# Patient Record
Sex: Female | Born: 1975
Health system: Southern US, Community
[De-identification: ages and names within clinical notes are randomized; demographics above are authoritative.]

## PROBLEM LIST (undated history)

## (undated) DIAGNOSIS — F419 Anxiety disorder, unspecified: Secondary | ICD-10-CM

## (undated) DIAGNOSIS — Z789 Other specified health status: Secondary | ICD-10-CM

## (undated) DIAGNOSIS — G43909 Migraine, unspecified, not intractable, without status migrainosus: Secondary | ICD-10-CM

## (undated) DIAGNOSIS — E039 Hypothyroidism, unspecified: Secondary | ICD-10-CM

## (undated) DIAGNOSIS — F1910 Other psychoactive substance abuse, uncomplicated: Secondary | ICD-10-CM

## (undated) HISTORY — PX: WISDOM TOOTH EXTRACTION: SHX21

## (undated) HISTORY — DX: Migraine, unspecified, not intractable, without status migrainosus: G43.909

## (undated) HISTORY — DX: Hypothyroidism, unspecified: E03.9

## (undated) HISTORY — DX: Anxiety disorder, unspecified: F41.9

## (undated) HISTORY — DX: Other psychoactive substance abuse, uncomplicated: F19.10

---

## 2012-05-22 ENCOUNTER — Other Ambulatory Visit: Payer: Self-pay | Admitting: Obstetrics & Gynecology

## 2012-05-22 ENCOUNTER — Other Ambulatory Visit: Payer: Self-pay | Admitting: Obstetrics and Gynecology

## 2012-05-22 DIAGNOSIS — N6320 Unspecified lump in the left breast, unspecified quadrant: Secondary | ICD-10-CM

## 2012-05-22 DIAGNOSIS — N6310 Unspecified lump in the right breast, unspecified quadrant: Secondary | ICD-10-CM

## 2012-05-24 ENCOUNTER — Ambulatory Visit
Admission: RE | Admit: 2012-05-24 | Discharge: 2012-05-24 | Disposition: A | Discharge status: Home / Self Care | Payer: Federal, State, Local not specified - PPO | Source: Ambulatory Visit | Attending: Obstetrics and Gynecology | Admitting: Obstetrics and Gynecology

## 2012-05-24 DIAGNOSIS — N6320 Unspecified lump in the left breast, unspecified quadrant: Secondary | ICD-10-CM

## 2012-05-24 DIAGNOSIS — N6310 Unspecified lump in the right breast, unspecified quadrant: Secondary | ICD-10-CM

## 2012-10-05 ENCOUNTER — Encounter (HOSPITAL_COMMUNITY): Payer: Self-pay | Admitting: *Deleted

## 2012-10-05 ENCOUNTER — Encounter (HOSPITAL_COMMUNITY): Payer: Self-pay

## 2012-10-09 NOTE — H&P (Signed)
Jennifer Scott is an 37 y.o. female with postcoital spotting.  She has a cervical polyp which has been removed but has reformed.  Additionally, the endometrial lining is thickened on ultrasound.  Pt wishes to maintain childbearing potential.    Pertinent Gynecological History: Menses: flow is moderate Bleeding: intermenstrual bleeding Contraception: none DES exposure: unknown Blood transfusions: none Sexually transmitted diseases: no past history Previous GYN Procedures: none  Last mammogram: normal Date: 2014 Last pap: normal Date: 2014 OB History: G0   Menstrual History: Menarche age: n/a No LMP recorded.    Past Medical History  Diagnosis Date  . Medical history non-contributory     History reviewed. No pertinent past surgical history.  History reviewed. No pertinent family history.  Social History:  reports that she has never smoked. She does not have any smokeless tobacco history on file. Her alcohol and drug histories are not on file.  Allergies: No Known Allergies  No prescriptions prior to admission    ROS  Height 5\' 9"  (1.753 m), weight 62.596 kg (138 lb). Physical Exam  Constitutional: She is oriented to person, place, and time. She appears well-developed and well-nourished.  Cardiovascular: Normal rate and regular rhythm.   Respiratory: Effort normal and breath sounds normal.  GI: Soft. There is no rebound and no guarding.  Neurological: She is alert and oriented to person, place, and time.  Skin: Skin is warm and dry.  Psychiatric: She has a normal mood and affect. Her behavior is normal.    No results found for this or any previous visit (from the past 24 hour(s)).  No results found.  Assessment/Plan: 37 yo G0 with postcoital bleeding, cervical polyp H/S, D&C, cervical polypectomy  Jennifer Scott 10/09/2012, 8:55 PM

## 2012-10-10 ENCOUNTER — Encounter (HOSPITAL_COMMUNITY): Payer: Self-pay | Admitting: *Deleted

## 2012-10-10 ENCOUNTER — Ambulatory Visit (HOSPITAL_COMMUNITY): Payer: Federal, State, Local not specified - PPO | Admitting: Anesthesiology

## 2012-10-10 ENCOUNTER — Encounter (HOSPITAL_COMMUNITY)
Admission: RE | Disposition: A | Discharge status: Home / Self Care | Payer: Self-pay | Source: Ambulatory Visit | Attending: Obstetrics & Gynecology

## 2012-10-10 ENCOUNTER — Encounter (HOSPITAL_COMMUNITY): Payer: Self-pay | Admitting: Anesthesiology

## 2012-10-10 ENCOUNTER — Ambulatory Visit (HOSPITAL_COMMUNITY)
Admission: RE | Admit: 2012-10-10 | Discharge: 2012-10-10 | Disposition: A | Discharge status: Home / Self Care | Payer: Federal, State, Local not specified - PPO | Source: Ambulatory Visit | Attending: Obstetrics & Gynecology | Admitting: Obstetrics & Gynecology

## 2012-10-10 DIAGNOSIS — N840 Polyp of corpus uteri: Secondary | ICD-10-CM | POA: Insufficient documentation

## 2012-10-10 DIAGNOSIS — N926 Irregular menstruation, unspecified: Secondary | ICD-10-CM | POA: Insufficient documentation

## 2012-10-10 DIAGNOSIS — N841 Polyp of cervix uteri: Secondary | ICD-10-CM | POA: Insufficient documentation

## 2012-10-10 DIAGNOSIS — N930 Postcoital and contact bleeding: Secondary | ICD-10-CM | POA: Insufficient documentation

## 2012-10-10 HISTORY — PX: HYSTEROSCOPY WITH D & C: SHX1775

## 2012-10-10 HISTORY — PX: CERVICAL POLYPECTOMY: SHX88

## 2012-10-10 HISTORY — DX: Other specified health status: Z78.9

## 2012-10-10 SURGERY — DILATATION AND CURETTAGE /HYSTEROSCOPY
Anesthesia: General | Site: Vagina | Wound class: Clean Contaminated

## 2012-10-10 MED ORDER — IBUPROFEN 800 MG PO TABS: 800.0000 mg | ORAL_TABLET | Freq: Four times a day (QID) | ORAL | Status: DC | PRN

## 2012-10-10 MED ORDER — DEXTROSE IN LACTATED RINGERS 5 % IV SOLN: INTRAVENOUS | Status: DC

## 2012-10-10 MED ORDER — GLYCINE 1.5 % IR SOLN
Status: DC | PRN
  Administered 2012-10-10: 3000 mL

## 2012-10-10 MED ORDER — FENTANYL CITRATE 0.05 MG/ML IJ SOLN
INTRAMUSCULAR | Status: DC | PRN
  Administered 2012-10-10 (×2): 50 ug via INTRAVENOUS

## 2012-10-10 MED ORDER — MIDAZOLAM HCL 5 MG/5ML IJ SOLN
INTRAMUSCULAR | Status: DC | PRN
  Administered 2012-10-10: 1 mg via INTRAVENOUS

## 2012-10-10 MED ORDER — FERRIC SUBSULFATE 259 MG/GM EX SOLN
CUTANEOUS | Status: DC | PRN
  Administered 2012-10-10: 1

## 2012-10-10 MED ORDER — KETOROLAC TROMETHAMINE 30 MG/ML IJ SOLN
INTRAMUSCULAR | Status: DC | PRN
  Administered 2012-10-10: 30 mg via INTRAVENOUS

## 2012-10-10 MED ORDER — KETOROLAC TROMETHAMINE 30 MG/ML IJ SOLN
INTRAMUSCULAR | Status: AC
  Filled 2012-10-10: qty 1

## 2012-10-10 MED ORDER — ONDANSETRON HCL 4 MG/2ML IJ SOLN
INTRAMUSCULAR | Status: DC | PRN
  Administered 2012-10-10: 4 mg via INTRAVENOUS

## 2012-10-10 MED ORDER — OXYCODONE-ACETAMINOPHEN 7.5-325 MG PO TABS: 1.0000 | ORAL_TABLET | ORAL | Status: DC | PRN

## 2012-10-10 MED ORDER — PROPOFOL 10 MG/ML IV BOLUS
INTRAVENOUS | Status: DC | PRN
  Administered 2012-10-10: 200 mg via INTRAVENOUS

## 2012-10-10 MED ORDER — ONDANSETRON HCL 4 MG/2ML IJ SOLN
INTRAMUSCULAR | Status: AC
  Filled 2012-10-10: qty 2

## 2012-10-10 MED ORDER — LACTATED RINGERS IV SOLN
INTRAVENOUS | Status: DC
  Administered 2012-10-10: 12:00:00 via INTRAVENOUS

## 2012-10-10 MED ORDER — LIDOCAINE HCL (CARDIAC) 20 MG/ML IV SOLN
INTRAVENOUS | Status: DC | PRN
  Administered 2012-10-10: 20 mg via INTRAVENOUS
  Administered 2012-10-10: 60 mg via INTRAVENOUS

## 2012-10-10 MED ORDER — FENTANYL CITRATE 0.05 MG/ML IJ SOLN: 25.0000 ug | INTRAMUSCULAR | Status: DC | PRN

## 2012-10-10 MED ORDER — LIDOCAINE HCL (CARDIAC) 20 MG/ML IV SOLN
INTRAVENOUS | Status: AC
  Filled 2012-10-10: qty 5

## 2012-10-10 MED ORDER — PROPOFOL 10 MG/ML IV EMUL
INTRAVENOUS | Status: AC
  Filled 2012-10-10: qty 20

## 2012-10-10 MED ORDER — MIDAZOLAM HCL 2 MG/2ML IJ SOLN
INTRAMUSCULAR | Status: AC
  Filled 2012-10-10: qty 2

## 2012-10-10 MED ORDER — FENTANYL CITRATE 0.05 MG/ML IJ SOLN
INTRAMUSCULAR | Status: AC
  Filled 2012-10-10: qty 2

## 2012-10-10 SURGICAL SUPPLY — 18 items
ABLATOR ENDOMETRIAL BIPOLAR (ABLATOR) IMPLANT
CANISTER SUCTION 2500CC (MISCELLANEOUS) ×2 IMPLANT
CATH ROBINSON RED A/P 16FR (CATHETERS) ×2 IMPLANT
CATH THERMACHOICE III (CATHETERS) IMPLANT
CLOTH BEACON ORANGE TIMEOUT ST (SAFETY) ×2 IMPLANT
CONTAINER PREFILL 10% NBF 60ML (FORM) ×4 IMPLANT
DRESSING TELFA 8X3 (GAUZE/BANDAGES/DRESSINGS) ×2 IMPLANT
ELECT REM PT RETURN 9FT ADLT (ELECTROSURGICAL) ×2
ELECTRODE REM PT RTRN 9FT ADLT (ELECTROSURGICAL) ×1 IMPLANT
GLOVE BIO SURGEON STRL SZ 6 (GLOVE) ×2 IMPLANT
GLOVE BIOGEL PI IND STRL 6 (GLOVE) ×2 IMPLANT
GLOVE BIOGEL PI INDICATOR 6 (GLOVE) ×2
GOWN STRL REIN XL XLG (GOWN DISPOSABLE) ×4 IMPLANT
LOOP ANGLED CUTTING 22FR (CUTTING LOOP) IMPLANT
PACK HYSTEROSCOPY LF (CUSTOM PROCEDURE TRAY) ×2 IMPLANT
PAD OB MATERNITY 4.3X12.25 (PERSONAL CARE ITEMS) ×2 IMPLANT
TOWEL OR 17X24 6PK STRL BLUE (TOWEL DISPOSABLE) ×4 IMPLANT
WATER STERILE IRR 1000ML POUR (IV SOLUTION) ×2 IMPLANT

## 2012-10-10 NOTE — Progress Notes (Signed)
No change to H&P. 

## 2012-10-10 NOTE — Anesthesia Preprocedure Evaluation (Signed)

## 2012-10-10 NOTE — Op Note (Signed)
PREOPERATIVE DIAGNOSIS:  Irregular uterine bleeding. POSTOPERATIVE DIAGNOSIS: The same PROCEDURE: Hysteroscopy, Dilation and Curettage, cervical polypectomy SURGEON:  Dr. Mitchel Honour   INDICATIONS: 37 y.o. G0 here for scheduled surgery for irregular uterine bleeding and cervical polyp.   Risks of surgery were discussed with the patient including but not limited to: bleeding which may require transfusion; infection which may require antibiotics; injury to uterus or surrounding organs; intrauterine scarring which may impair future fertility; need for additional procedures including laparotomy or laparoscopy; and other postoperative/anesthesia complications. Written informed consent was obtained.    FINDINGS:  A 6 week size uterus.  Diffuse proliferative endometrium.  Normal ostia bilaterally.  Cervical polyp  ANESTHESIA:   General ESTIMATED BLOOD LOSS:  Less than 20 ml SPECIMENS: Endometrial curettings and cervical biopsy sent to pathology COMPLICATIONS:  None immediate.  PROCEDURE DETAILS:  The patient was then taken to the operating room where general anesthesia was administered and was found to be adequate.  After an adequate timeout was performed, she was placed in the dorsal lithotomy position and examined; then prepped and draped in the sterile manner.   Her bladder was catheterized for an unmeasured amount of clear, yellow urine. A speculum was then placed in the patient's vagina and a single tooth tenaculum was applied to the anterior lip of the cervix.   The uteus was sounded to 7.5 cm and dilated manually with metal dilators to accommodate the 5.5 mm diagnostic hysteroscope.  Once the cervix was dilated, the hysteroscope was inserted under direct visualization using saline as a suspension medium.  The uterine cavity was carefully examined, both ostia were recognized.   After further careful visualization of the uterine cavity, the hysteroscope was removed under direct visualization.  A sharp  curettage was then performed to obtain a moderate amount of endometrial curettings.  The tenaculum was removed from the anterior lip of the cervix.  Attention was then turned to the cervical polyp which was removed using the Bovie knife.  The base was cauterized using the Bovie and Monsel's.  The vaginal speculum was removed after noting good hemostasis.  The patient tolerated the procedure well and was taken to the recovery area awake, extubated and in stable condition.

## 2012-10-10 NOTE — Transfer of Care (Signed)
Immediate Anesthesia Transfer of Care Note  Patient: Jennifer Scott  Procedure(s) Performed: Procedure(s): DILATATION AND CURETTAGE /HYSTEROSCOPY (N/A) CERVICAL POLYPECTOMY (N/A)  Patient Location: PACU  Anesthesia Type:General  Level of Consciousness: awake, oriented and patient cooperative  Airway & Oxygen Therapy: Patient Spontanous Breathing and Patient connected to nasal cannula oxygen  Post-op Assessment: Report given to PACU RN and Post -op Vital signs reviewed and stable  Post vital signs: Reviewed and stable  Complications: No apparent anesthesia complications

## 2012-10-11 ENCOUNTER — Encounter (HOSPITAL_COMMUNITY): Payer: Self-pay | Admitting: Obstetrics & Gynecology

## 2012-10-12 NOTE — Anesthesia Postprocedure Evaluation (Signed)
  Anesthesia Post-op Note  Patient: Jennifer Scott  Procedure(s) Performed: Procedure(s): DILATATION AND CURETTAGE /HYSTEROSCOPY (N/A) CERVICAL POLYPECTOMY (N/A) Patient is awake and responsive. Pain and nausea are reasonably well controlled. Vital signs are stable and clinically acceptable. Oxygen saturation is clinically acceptable. There are no apparent anesthetic complications at this time. Patient is ready for discharge.

## 2014-05-23 ENCOUNTER — Other Ambulatory Visit: Payer: Self-pay | Admitting: Obstetrics & Gynecology

## 2014-05-23 DIAGNOSIS — N6320 Unspecified lump in the left breast, unspecified quadrant: Secondary | ICD-10-CM

## 2014-05-30 ENCOUNTER — Other Ambulatory Visit: Payer: Self-pay | Admitting: Obstetrics & Gynecology

## 2014-05-30 ENCOUNTER — Ambulatory Visit
Admission: RE | Admit: 2014-05-30 | Discharge: 2014-05-30 | Disposition: A | Discharge status: Home / Self Care | Payer: Federal, State, Local not specified - PPO | Source: Ambulatory Visit | Attending: Obstetrics & Gynecology | Admitting: Obstetrics & Gynecology

## 2014-05-30 ENCOUNTER — Encounter (INDEPENDENT_AMBULATORY_CARE_PROVIDER_SITE_OTHER): Payer: Self-pay

## 2014-05-30 DIAGNOSIS — N6320 Unspecified lump in the left breast, unspecified quadrant: Secondary | ICD-10-CM

## 2014-06-25 ENCOUNTER — Other Ambulatory Visit: Payer: Self-pay | Admitting: Obstetrics & Gynecology

## 2014-09-24 ENCOUNTER — Other Ambulatory Visit: Payer: Self-pay | Admitting: Physician Assistant

## 2015-07-21 ENCOUNTER — Other Ambulatory Visit: Payer: Self-pay | Admitting: Obstetrics & Gynecology

## 2015-07-21 DIAGNOSIS — R922 Inconclusive mammogram: Secondary | ICD-10-CM

## 2015-07-21 DIAGNOSIS — Z1329 Encounter for screening for other suspected endocrine disorder: Secondary | ICD-10-CM | POA: Diagnosis not present

## 2015-07-21 DIAGNOSIS — Z1321 Encounter for screening for nutritional disorder: Secondary | ICD-10-CM | POA: Diagnosis not present

## 2015-07-21 DIAGNOSIS — Z13228 Encounter for screening for other metabolic disorders: Secondary | ICD-10-CM | POA: Diagnosis not present

## 2015-07-21 DIAGNOSIS — Z13220 Encounter for screening for lipoid disorders: Secondary | ICD-10-CM | POA: Diagnosis not present

## 2015-07-28 ENCOUNTER — Ambulatory Visit
Admission: RE | Admit: 2015-07-28 | Discharge: 2015-07-28 | Disposition: A | Discharge status: Home / Self Care | Payer: Federal, State, Local not specified - PPO | Source: Ambulatory Visit | Attending: Obstetrics & Gynecology | Admitting: Obstetrics & Gynecology

## 2015-07-28 DIAGNOSIS — R922 Inconclusive mammogram: Secondary | ICD-10-CM

## 2015-07-28 DIAGNOSIS — N63 Unspecified lump in breast: Secondary | ICD-10-CM | POA: Diagnosis not present

## 2015-07-28 DIAGNOSIS — N6012 Diffuse cystic mastopathy of left breast: Secondary | ICD-10-CM | POA: Diagnosis not present

## 2015-07-29 DIAGNOSIS — F4322 Adjustment disorder with anxiety: Secondary | ICD-10-CM | POA: Diagnosis not present

## 2015-08-12 DIAGNOSIS — F4322 Adjustment disorder with anxiety: Secondary | ICD-10-CM | POA: Diagnosis not present

## 2015-08-25 DIAGNOSIS — F4322 Adjustment disorder with anxiety: Secondary | ICD-10-CM | POA: Diagnosis not present

## 2015-09-15 DIAGNOSIS — F4322 Adjustment disorder with anxiety: Secondary | ICD-10-CM | POA: Diagnosis not present

## 2015-09-29 DIAGNOSIS — F4322 Adjustment disorder with anxiety: Secondary | ICD-10-CM | POA: Diagnosis not present

## 2015-10-13 DIAGNOSIS — F4322 Adjustment disorder with anxiety: Secondary | ICD-10-CM | POA: Diagnosis not present

## 2015-10-28 DIAGNOSIS — F4322 Adjustment disorder with anxiety: Secondary | ICD-10-CM | POA: Diagnosis not present

## 2015-11-16 DIAGNOSIS — F4322 Adjustment disorder with anxiety: Secondary | ICD-10-CM | POA: Diagnosis not present

## 2015-12-04 DIAGNOSIS — F4322 Adjustment disorder with anxiety: Secondary | ICD-10-CM | POA: Diagnosis not present

## 2016-01-04 DIAGNOSIS — K080 Exfoliation of teeth due to systemic causes: Secondary | ICD-10-CM | POA: Diagnosis not present

## 2016-01-20 DIAGNOSIS — F4320 Adjustment disorder, unspecified: Secondary | ICD-10-CM | POA: Diagnosis not present

## 2016-02-04 DIAGNOSIS — F4320 Adjustment disorder, unspecified: Secondary | ICD-10-CM | POA: Diagnosis not present

## 2016-02-17 DIAGNOSIS — F4320 Adjustment disorder, unspecified: Secondary | ICD-10-CM | POA: Diagnosis not present

## 2016-03-04 DIAGNOSIS — F4320 Adjustment disorder, unspecified: Secondary | ICD-10-CM | POA: Diagnosis not present

## 2016-03-16 DIAGNOSIS — F4320 Adjustment disorder, unspecified: Secondary | ICD-10-CM | POA: Diagnosis not present

## 2016-04-01 DIAGNOSIS — F4320 Adjustment disorder, unspecified: Secondary | ICD-10-CM | POA: Diagnosis not present

## 2016-05-11 DIAGNOSIS — F4320 Adjustment disorder, unspecified: Secondary | ICD-10-CM | POA: Diagnosis not present

## 2016-06-02 DIAGNOSIS — F4320 Adjustment disorder, unspecified: Secondary | ICD-10-CM | POA: Diagnosis not present

## 2016-06-15 DIAGNOSIS — F4320 Adjustment disorder, unspecified: Secondary | ICD-10-CM | POA: Diagnosis not present

## 2016-06-30 DIAGNOSIS — F4320 Adjustment disorder, unspecified: Secondary | ICD-10-CM | POA: Diagnosis not present

## 2016-07-04 DIAGNOSIS — K080 Exfoliation of teeth due to systemic causes: Secondary | ICD-10-CM | POA: Diagnosis not present

## 2016-07-25 DIAGNOSIS — F4320 Adjustment disorder, unspecified: Secondary | ICD-10-CM | POA: Diagnosis not present

## 2016-08-01 DIAGNOSIS — Z01419 Encounter for gynecological examination (general) (routine) without abnormal findings: Secondary | ICD-10-CM | POA: Diagnosis not present

## 2016-08-01 DIAGNOSIS — R635 Abnormal weight gain: Secondary | ICD-10-CM | POA: Diagnosis not present

## 2016-08-01 DIAGNOSIS — Z6822 Body mass index (BMI) 22.0-22.9, adult: Secondary | ICD-10-CM | POA: Diagnosis not present

## 2016-08-01 DIAGNOSIS — E6609 Other obesity due to excess calories: Secondary | ICD-10-CM | POA: Diagnosis not present

## 2016-08-04 DIAGNOSIS — E039 Hypothyroidism, unspecified: Secondary | ICD-10-CM | POA: Diagnosis not present

## 2016-08-11 DIAGNOSIS — F4320 Adjustment disorder, unspecified: Secondary | ICD-10-CM | POA: Diagnosis not present

## 2016-08-18 DIAGNOSIS — F4320 Adjustment disorder, unspecified: Secondary | ICD-10-CM | POA: Diagnosis not present

## 2016-08-29 DIAGNOSIS — R5383 Other fatigue: Secondary | ICD-10-CM | POA: Diagnosis not present

## 2016-08-29 DIAGNOSIS — Z0000 Encounter for general adult medical examination without abnormal findings: Secondary | ICD-10-CM | POA: Diagnosis not present

## 2016-08-29 DIAGNOSIS — Z131 Encounter for screening for diabetes mellitus: Secondary | ICD-10-CM | POA: Diagnosis not present

## 2016-08-29 DIAGNOSIS — E039 Hypothyroidism, unspecified: Secondary | ICD-10-CM | POA: Diagnosis not present

## 2016-08-29 DIAGNOSIS — R635 Abnormal weight gain: Secondary | ICD-10-CM | POA: Diagnosis not present

## 2016-08-29 DIAGNOSIS — E559 Vitamin D deficiency, unspecified: Secondary | ICD-10-CM | POA: Diagnosis not present

## 2016-09-14 DIAGNOSIS — F4320 Adjustment disorder, unspecified: Secondary | ICD-10-CM | POA: Diagnosis not present

## 2016-09-20 DIAGNOSIS — R635 Abnormal weight gain: Secondary | ICD-10-CM | POA: Diagnosis not present

## 2016-09-20 DIAGNOSIS — Z713 Dietary counseling and surveillance: Secondary | ICD-10-CM | POA: Diagnosis not present

## 2016-09-20 DIAGNOSIS — E039 Hypothyroidism, unspecified: Secondary | ICD-10-CM | POA: Diagnosis not present

## 2016-09-20 DIAGNOSIS — R5383 Other fatigue: Secondary | ICD-10-CM | POA: Diagnosis not present

## 2016-10-07 DIAGNOSIS — D235 Other benign neoplasm of skin of trunk: Secondary | ICD-10-CM | POA: Diagnosis not present

## 2016-10-12 DIAGNOSIS — F4320 Adjustment disorder, unspecified: Secondary | ICD-10-CM | POA: Diagnosis not present

## 2016-10-24 DIAGNOSIS — F4320 Adjustment disorder, unspecified: Secondary | ICD-10-CM | POA: Diagnosis not present

## 2016-11-08 DIAGNOSIS — E039 Hypothyroidism, unspecified: Secondary | ICD-10-CM | POA: Diagnosis not present

## 2016-11-08 DIAGNOSIS — R6882 Decreased libido: Secondary | ICD-10-CM | POA: Diagnosis not present

## 2016-11-08 DIAGNOSIS — R194 Change in bowel habit: Secondary | ICD-10-CM | POA: Diagnosis not present

## 2016-11-08 DIAGNOSIS — N926 Irregular menstruation, unspecified: Secondary | ICD-10-CM | POA: Diagnosis not present

## 2016-11-08 DIAGNOSIS — F439 Reaction to severe stress, unspecified: Secondary | ICD-10-CM | POA: Diagnosis not present

## 2016-11-09 DIAGNOSIS — F4320 Adjustment disorder, unspecified: Secondary | ICD-10-CM | POA: Diagnosis not present

## 2016-11-21 DIAGNOSIS — F4320 Adjustment disorder, unspecified: Secondary | ICD-10-CM | POA: Diagnosis not present

## 2016-11-21 DIAGNOSIS — N926 Irregular menstruation, unspecified: Secondary | ICD-10-CM | POA: Diagnosis not present

## 2016-11-21 DIAGNOSIS — F439 Reaction to severe stress, unspecified: Secondary | ICD-10-CM | POA: Diagnosis not present

## 2016-11-21 DIAGNOSIS — R6882 Decreased libido: Secondary | ICD-10-CM | POA: Diagnosis not present

## 2016-11-21 DIAGNOSIS — E039 Hypothyroidism, unspecified: Secondary | ICD-10-CM | POA: Diagnosis not present

## 2016-11-28 DIAGNOSIS — F4320 Adjustment disorder, unspecified: Secondary | ICD-10-CM | POA: Diagnosis not present

## 2016-12-30 DIAGNOSIS — F4320 Adjustment disorder, unspecified: Secondary | ICD-10-CM | POA: Diagnosis not present

## 2017-01-13 DIAGNOSIS — F4320 Adjustment disorder, unspecified: Secondary | ICD-10-CM | POA: Diagnosis not present

## 2017-01-24 DIAGNOSIS — F4320 Adjustment disorder, unspecified: Secondary | ICD-10-CM | POA: Diagnosis not present

## 2017-02-01 DIAGNOSIS — E039 Hypothyroidism, unspecified: Secondary | ICD-10-CM | POA: Diagnosis not present

## 2017-02-09 DIAGNOSIS — F4320 Adjustment disorder, unspecified: Secondary | ICD-10-CM | POA: Diagnosis not present

## 2017-03-06 DIAGNOSIS — F4320 Adjustment disorder, unspecified: Secondary | ICD-10-CM | POA: Diagnosis not present

## 2017-03-20 DIAGNOSIS — Z23 Encounter for immunization: Secondary | ICD-10-CM | POA: Diagnosis not present

## 2017-03-20 DIAGNOSIS — F418 Other specified anxiety disorders: Secondary | ICD-10-CM | POA: Diagnosis not present

## 2017-03-21 DIAGNOSIS — F4320 Adjustment disorder, unspecified: Secondary | ICD-10-CM | POA: Diagnosis not present

## 2017-04-04 DIAGNOSIS — F4320 Adjustment disorder, unspecified: Secondary | ICD-10-CM | POA: Diagnosis not present

## 2017-04-20 DIAGNOSIS — F4320 Adjustment disorder, unspecified: Secondary | ICD-10-CM | POA: Diagnosis not present

## 2017-05-08 DIAGNOSIS — F418 Other specified anxiety disorders: Secondary | ICD-10-CM | POA: Diagnosis not present

## 2017-05-09 DIAGNOSIS — F439 Reaction to severe stress, unspecified: Secondary | ICD-10-CM | POA: Diagnosis not present

## 2017-05-09 DIAGNOSIS — R5383 Other fatigue: Secondary | ICD-10-CM | POA: Diagnosis not present

## 2017-06-05 DIAGNOSIS — F4320 Adjustment disorder, unspecified: Secondary | ICD-10-CM | POA: Diagnosis not present

## 2017-06-16 DIAGNOSIS — F4320 Adjustment disorder, unspecified: Secondary | ICD-10-CM | POA: Diagnosis not present

## 2017-06-29 DIAGNOSIS — F4320 Adjustment disorder, unspecified: Secondary | ICD-10-CM | POA: Diagnosis not present

## 2017-07-20 DIAGNOSIS — J069 Acute upper respiratory infection, unspecified: Secondary | ICD-10-CM | POA: Diagnosis not present

## 2017-07-20 DIAGNOSIS — M791 Myalgia, unspecified site: Secondary | ICD-10-CM | POA: Diagnosis not present

## 2017-07-26 DIAGNOSIS — F4320 Adjustment disorder, unspecified: Secondary | ICD-10-CM | POA: Diagnosis not present

## 2017-08-17 DIAGNOSIS — N644 Mastodynia: Secondary | ICD-10-CM | POA: Diagnosis not present

## 2017-08-17 DIAGNOSIS — F411 Generalized anxiety disorder: Secondary | ICD-10-CM | POA: Diagnosis not present

## 2017-08-23 DIAGNOSIS — R55 Syncope and collapse: Secondary | ICD-10-CM | POA: Diagnosis not present

## 2017-08-25 ENCOUNTER — Other Ambulatory Visit: Payer: Self-pay | Admitting: Physician Assistant

## 2017-08-25 DIAGNOSIS — F4320 Adjustment disorder, unspecified: Secondary | ICD-10-CM | POA: Diagnosis not present

## 2017-08-25 DIAGNOSIS — N644 Mastodynia: Secondary | ICD-10-CM

## 2017-09-08 DIAGNOSIS — F4320 Adjustment disorder, unspecified: Secondary | ICD-10-CM | POA: Diagnosis not present

## 2017-09-12 DIAGNOSIS — R5383 Other fatigue: Secondary | ICD-10-CM | POA: Diagnosis not present

## 2017-09-13 ENCOUNTER — Other Ambulatory Visit: Payer: Self-pay | Admitting: Physician Assistant

## 2017-09-13 DIAGNOSIS — Z1231 Encounter for screening mammogram for malignant neoplasm of breast: Secondary | ICD-10-CM

## 2017-09-21 DIAGNOSIS — F4320 Adjustment disorder, unspecified: Secondary | ICD-10-CM | POA: Diagnosis not present

## 2017-10-03 ENCOUNTER — Ambulatory Visit
Admission: RE | Admit: 2017-10-03 | Discharge: 2017-10-03 | Disposition: A | Discharge status: Home / Self Care | Payer: Federal, State, Local not specified - PPO | Source: Ambulatory Visit | Attending: Physician Assistant | Admitting: Physician Assistant

## 2017-10-03 DIAGNOSIS — Z1231 Encounter for screening mammogram for malignant neoplasm of breast: Secondary | ICD-10-CM | POA: Diagnosis not present

## 2017-10-10 DIAGNOSIS — F4320 Adjustment disorder, unspecified: Secondary | ICD-10-CM | POA: Diagnosis not present

## 2017-11-02 DIAGNOSIS — F4320 Adjustment disorder, unspecified: Secondary | ICD-10-CM | POA: Diagnosis not present

## 2017-11-22 DIAGNOSIS — F4320 Adjustment disorder, unspecified: Secondary | ICD-10-CM | POA: Diagnosis not present

## 2017-12-05 DIAGNOSIS — F4320 Adjustment disorder, unspecified: Secondary | ICD-10-CM | POA: Diagnosis not present

## 2018-01-02 DIAGNOSIS — Z131 Encounter for screening for diabetes mellitus: Secondary | ICD-10-CM | POA: Diagnosis not present

## 2018-01-02 DIAGNOSIS — G4489 Other headache syndrome: Secondary | ICD-10-CM | POA: Diagnosis not present

## 2018-01-02 DIAGNOSIS — E039 Hypothyroidism, unspecified: Secondary | ICD-10-CM | POA: Diagnosis not present

## 2018-01-02 DIAGNOSIS — E559 Vitamin D deficiency, unspecified: Secondary | ICD-10-CM | POA: Diagnosis not present

## 2018-01-02 DIAGNOSIS — G4700 Insomnia, unspecified: Secondary | ICD-10-CM | POA: Diagnosis not present

## 2018-01-02 DIAGNOSIS — Z0000 Encounter for general adult medical examination without abnormal findings: Secondary | ICD-10-CM | POA: Diagnosis not present

## 2018-01-02 DIAGNOSIS — F411 Generalized anxiety disorder: Secondary | ICD-10-CM | POA: Diagnosis not present

## 2018-01-02 DIAGNOSIS — Z13220 Encounter for screening for lipoid disorders: Secondary | ICD-10-CM | POA: Diagnosis not present

## 2018-01-02 DIAGNOSIS — R5383 Other fatigue: Secondary | ICD-10-CM | POA: Diagnosis not present

## 2018-01-04 DIAGNOSIS — F411 Generalized anxiety disorder: Secondary | ICD-10-CM | POA: Diagnosis not present

## 2018-01-04 DIAGNOSIS — F418 Other specified anxiety disorders: Secondary | ICD-10-CM | POA: Diagnosis not present

## 2018-01-04 DIAGNOSIS — G43009 Migraine without aura, not intractable, without status migrainosus: Secondary | ICD-10-CM | POA: Diagnosis not present

## 2018-01-04 DIAGNOSIS — E039 Hypothyroidism, unspecified: Secondary | ICD-10-CM | POA: Diagnosis not present

## 2018-01-04 DIAGNOSIS — F4320 Adjustment disorder, unspecified: Secondary | ICD-10-CM | POA: Diagnosis not present

## 2018-01-23 DIAGNOSIS — F4320 Adjustment disorder, unspecified: Secondary | ICD-10-CM | POA: Diagnosis not present

## 2018-02-06 DIAGNOSIS — F4320 Adjustment disorder, unspecified: Secondary | ICD-10-CM | POA: Diagnosis not present

## 2018-02-15 DIAGNOSIS — Z681 Body mass index (BMI) 19 or less, adult: Secondary | ICD-10-CM | POA: Diagnosis not present

## 2018-02-15 DIAGNOSIS — Z01419 Encounter for gynecological examination (general) (routine) without abnormal findings: Secondary | ICD-10-CM | POA: Diagnosis not present

## 2018-02-20 DIAGNOSIS — F4320 Adjustment disorder, unspecified: Secondary | ICD-10-CM | POA: Diagnosis not present

## 2018-03-12 DIAGNOSIS — F4320 Adjustment disorder, unspecified: Secondary | ICD-10-CM | POA: Diagnosis not present

## 2018-03-28 DIAGNOSIS — F4320 Adjustment disorder, unspecified: Secondary | ICD-10-CM | POA: Diagnosis not present

## 2018-04-20 DIAGNOSIS — F4320 Adjustment disorder, unspecified: Secondary | ICD-10-CM | POA: Diagnosis not present

## 2018-05-01 DIAGNOSIS — F4320 Adjustment disorder, unspecified: Secondary | ICD-10-CM | POA: Diagnosis not present

## 2018-05-03 DIAGNOSIS — F418 Other specified anxiety disorders: Secondary | ICD-10-CM | POA: Diagnosis not present

## 2018-05-03 DIAGNOSIS — G43009 Migraine without aura, not intractable, without status migrainosus: Secondary | ICD-10-CM | POA: Diagnosis not present

## 2018-05-03 DIAGNOSIS — F5101 Primary insomnia: Secondary | ICD-10-CM | POA: Diagnosis not present

## 2018-05-15 DIAGNOSIS — G4700 Insomnia, unspecified: Secondary | ICD-10-CM | POA: Diagnosis not present

## 2018-05-15 DIAGNOSIS — E039 Hypothyroidism, unspecified: Secondary | ICD-10-CM | POA: Diagnosis not present

## 2018-05-17 DIAGNOSIS — L700 Acne vulgaris: Secondary | ICD-10-CM | POA: Diagnosis not present

## 2018-05-31 DIAGNOSIS — F4320 Adjustment disorder, unspecified: Secondary | ICD-10-CM | POA: Diagnosis not present

## 2018-06-05 DIAGNOSIS — Z20828 Contact with and (suspected) exposure to other viral communicable diseases: Secondary | ICD-10-CM | POA: Diagnosis not present

## 2018-06-14 DIAGNOSIS — F4320 Adjustment disorder, unspecified: Secondary | ICD-10-CM | POA: Diagnosis not present

## 2018-06-25 ENCOUNTER — Encounter (INDEPENDENT_AMBULATORY_CARE_PROVIDER_SITE_OTHER): Payer: Self-pay | Admitting: Internal Medicine

## 2018-07-04 DIAGNOSIS — F4320 Adjustment disorder, unspecified: Secondary | ICD-10-CM | POA: Diagnosis not present

## 2018-07-12 ENCOUNTER — Encounter (INDEPENDENT_AMBULATORY_CARE_PROVIDER_SITE_OTHER): Payer: Self-pay | Admitting: Internal Medicine

## 2018-07-18 DIAGNOSIS — F4320 Adjustment disorder, unspecified: Secondary | ICD-10-CM | POA: Diagnosis not present

## 2018-07-19 ENCOUNTER — Ambulatory Visit (INDEPENDENT_AMBULATORY_CARE_PROVIDER_SITE_OTHER): Payer: Federal, State, Local not specified - PPO | Admitting: Internal Medicine

## 2018-08-01 DIAGNOSIS — F4320 Adjustment disorder, unspecified: Secondary | ICD-10-CM | POA: Diagnosis not present

## 2018-08-15 DIAGNOSIS — F4320 Adjustment disorder, unspecified: Secondary | ICD-10-CM | POA: Diagnosis not present

## 2018-10-05 DIAGNOSIS — F4320 Adjustment disorder, unspecified: Secondary | ICD-10-CM | POA: Diagnosis not present

## 2018-10-19 DIAGNOSIS — F4320 Adjustment disorder, unspecified: Secondary | ICD-10-CM | POA: Diagnosis not present

## 2018-11-02 DIAGNOSIS — F418 Other specified anxiety disorders: Secondary | ICD-10-CM | POA: Diagnosis not present

## 2018-11-02 DIAGNOSIS — F1021 Alcohol dependence, in remission: Secondary | ICD-10-CM | POA: Diagnosis not present

## 2018-11-02 DIAGNOSIS — E039 Hypothyroidism, unspecified: Secondary | ICD-10-CM | POA: Diagnosis not present

## 2018-11-02 DIAGNOSIS — G43009 Migraine without aura, not intractable, without status migrainosus: Secondary | ICD-10-CM | POA: Diagnosis not present

## 2018-11-08 DIAGNOSIS — F4320 Adjustment disorder, unspecified: Secondary | ICD-10-CM | POA: Diagnosis not present

## 2018-12-13 DIAGNOSIS — F4320 Adjustment disorder, unspecified: Secondary | ICD-10-CM | POA: Diagnosis not present

## 2019-01-08 DIAGNOSIS — F4320 Adjustment disorder, unspecified: Secondary | ICD-10-CM | POA: Diagnosis not present

## 2019-01-28 DIAGNOSIS — Z20828 Contact with and (suspected) exposure to other viral communicable diseases: Secondary | ICD-10-CM | POA: Diagnosis not present

## 2019-02-14 DIAGNOSIS — F4320 Adjustment disorder, unspecified: Secondary | ICD-10-CM | POA: Diagnosis not present

## 2019-02-21 DIAGNOSIS — E039 Hypothyroidism, unspecified: Secondary | ICD-10-CM | POA: Diagnosis not present

## 2019-02-28 DIAGNOSIS — F4320 Adjustment disorder, unspecified: Secondary | ICD-10-CM | POA: Diagnosis not present

## 2019-03-12 DIAGNOSIS — F4320 Adjustment disorder, unspecified: Secondary | ICD-10-CM | POA: Diagnosis not present

## 2019-03-19 DIAGNOSIS — Z01419 Encounter for gynecological examination (general) (routine) without abnormal findings: Secondary | ICD-10-CM | POA: Diagnosis not present

## 2019-03-19 DIAGNOSIS — E039 Hypothyroidism, unspecified: Secondary | ICD-10-CM | POA: Insufficient documentation

## 2019-03-19 DIAGNOSIS — L709 Acne, unspecified: Secondary | ICD-10-CM | POA: Insufficient documentation

## 2019-03-19 DIAGNOSIS — F419 Anxiety disorder, unspecified: Secondary | ICD-10-CM | POA: Insufficient documentation

## 2019-03-19 DIAGNOSIS — Z681 Body mass index (BMI) 19 or less, adult: Secondary | ICD-10-CM | POA: Diagnosis not present

## 2019-03-20 ENCOUNTER — Other Ambulatory Visit: Payer: Self-pay | Admitting: Obstetrics & Gynecology

## 2019-03-20 DIAGNOSIS — Z1231 Encounter for screening mammogram for malignant neoplasm of breast: Secondary | ICD-10-CM

## 2019-04-02 DIAGNOSIS — F4320 Adjustment disorder, unspecified: Secondary | ICD-10-CM | POA: Diagnosis not present

## 2019-04-22 ENCOUNTER — Other Ambulatory Visit: Payer: Federal, State, Local not specified - PPO

## 2019-04-26 DIAGNOSIS — F4320 Adjustment disorder, unspecified: Secondary | ICD-10-CM | POA: Diagnosis not present

## 2019-05-07 DIAGNOSIS — F4320 Adjustment disorder, unspecified: Secondary | ICD-10-CM | POA: Diagnosis not present

## 2019-05-09 ENCOUNTER — Other Ambulatory Visit: Payer: Self-pay

## 2019-05-09 ENCOUNTER — Ambulatory Visit
Admission: RE | Admit: 2019-05-09 | Discharge: 2019-05-09 | Disposition: A | Discharge status: Home / Self Care | Payer: Federal, State, Local not specified - PPO | Source: Ambulatory Visit | Attending: Obstetrics & Gynecology | Admitting: Obstetrics & Gynecology

## 2019-05-09 DIAGNOSIS — Z1231 Encounter for screening mammogram for malignant neoplasm of breast: Secondary | ICD-10-CM | POA: Diagnosis not present

## 2019-05-13 ENCOUNTER — Other Ambulatory Visit: Payer: Self-pay | Admitting: Obstetrics & Gynecology

## 2019-05-13 DIAGNOSIS — R928 Other abnormal and inconclusive findings on diagnostic imaging of breast: Secondary | ICD-10-CM

## 2019-05-23 ENCOUNTER — Other Ambulatory Visit: Payer: Self-pay | Admitting: Obstetrics & Gynecology

## 2019-05-23 ENCOUNTER — Ambulatory Visit
Admission: RE | Admit: 2019-05-23 | Discharge: 2019-05-23 | Disposition: A | Discharge status: Home / Self Care | Payer: Federal, State, Local not specified - PPO | Source: Ambulatory Visit | Attending: Obstetrics & Gynecology | Admitting: Obstetrics & Gynecology

## 2019-05-23 ENCOUNTER — Other Ambulatory Visit: Payer: Self-pay

## 2019-05-23 DIAGNOSIS — F4320 Adjustment disorder, unspecified: Secondary | ICD-10-CM | POA: Diagnosis not present

## 2019-05-23 DIAGNOSIS — R921 Mammographic calcification found on diagnostic imaging of breast: Secondary | ICD-10-CM

## 2019-05-23 DIAGNOSIS — R928 Other abnormal and inconclusive findings on diagnostic imaging of breast: Secondary | ICD-10-CM

## 2019-06-21 DIAGNOSIS — F4320 Adjustment disorder, unspecified: Secondary | ICD-10-CM | POA: Diagnosis not present

## 2019-06-26 ENCOUNTER — Ambulatory Visit
Admission: RE | Admit: 2019-06-26 | Discharge: 2019-06-26 | Disposition: A | Discharge status: Home / Self Care | Payer: Federal, State, Local not specified - PPO | Source: Ambulatory Visit | Attending: Obstetrics & Gynecology | Admitting: Obstetrics & Gynecology

## 2019-06-26 ENCOUNTER — Other Ambulatory Visit: Payer: Self-pay

## 2019-06-26 DIAGNOSIS — R928 Other abnormal and inconclusive findings on diagnostic imaging of breast: Secondary | ICD-10-CM | POA: Diagnosis not present

## 2019-06-26 DIAGNOSIS — R921 Mammographic calcification found on diagnostic imaging of breast: Secondary | ICD-10-CM

## 2019-06-26 DIAGNOSIS — N6011 Diffuse cystic mastopathy of right breast: Secondary | ICD-10-CM | POA: Diagnosis not present

## 2019-06-26 HISTORY — PX: BREAST BIOPSY: SHX20

## 2019-06-27 ENCOUNTER — Ambulatory Visit: Payer: Federal, State, Local not specified - PPO | Attending: Internal Medicine

## 2019-06-27 DIAGNOSIS — Z23 Encounter for immunization: Secondary | ICD-10-CM

## 2019-06-27 NOTE — Progress Notes (Signed)
   Covid-19 Vaccination Clinic  Name:  Jennifer SOOHOO    MRN: [id number] DOB: February 23, 1976  06/27/2019  [name] was observed post Covid-19 immunization for 15 minutes without incident. She was provided with Vaccine Information Sheet and instruction to access the V-Safe system.   [name] was instructed to call 911 with any severe reactions post vaccine: Marland Kitchen Difficulty breathing  . Swelling of face and throat  . A fast heartbeat  . A bad rash all over body  . Dizziness and weakness   Immunizations Administered    Name Date Dose VIS Date Route   Pfizer COVID-19 Vaccine 06/27/2019  9:12 AM 0.3 mL 03/29/2019 Intramuscular   Manufacturer: [place]   Lot: [id number]   [place]: [id number]

## 2019-07-11 DIAGNOSIS — F4320 Adjustment disorder, unspecified: Secondary | ICD-10-CM | POA: Diagnosis not present

## 2019-07-22 ENCOUNTER — Ambulatory Visit: Payer: Federal, State, Local not specified - PPO | Attending: Internal Medicine

## 2019-07-22 DIAGNOSIS — Z23 Encounter for immunization: Secondary | ICD-10-CM

## 2019-07-22 NOTE — Progress Notes (Signed)
   Covid-19 Vaccination Clinic  Name:  Jennifer Scott    MRN: [id number] DOB: 07-Sep-1975  07/22/2019  Ms. Curl was observed post Covid-19 immunization for 15 minutes without incident. She was provided with Vaccine Information Sheet and instruction to access the V-Safe system.   [name] was instructed to call 911 with any severe reactions post vaccine: Marland Kitchen Difficulty breathing  . Swelling of face and throat  . A fast heartbeat  . A bad rash all over body  . Dizziness and weakness   Immunizations Administered    Name Date Dose VIS Date Route   Pfizer COVID-19 Vaccine 07/22/2019  8:47 AM 0.3 mL 03/29/2019 Intramuscular   Manufacturer: [place]   Lot: [id number]   [place]: [id number]

## 2019-08-08 DIAGNOSIS — F4320 Adjustment disorder, unspecified: Secondary | ICD-10-CM | POA: Diagnosis not present

## 2019-08-21 IMAGING — MG DIGITAL SCREENING BILATERAL MAMMOGRAM WITH CAD
4 series · 4 of 4 positions shown · non-contrast
Comparison: Previous exam(s).

CLINICAL DATA: Screening.

EXAM:
DIGITAL SCREENING BILATERAL MAMMOGRAM WITH CAD

[L CC]
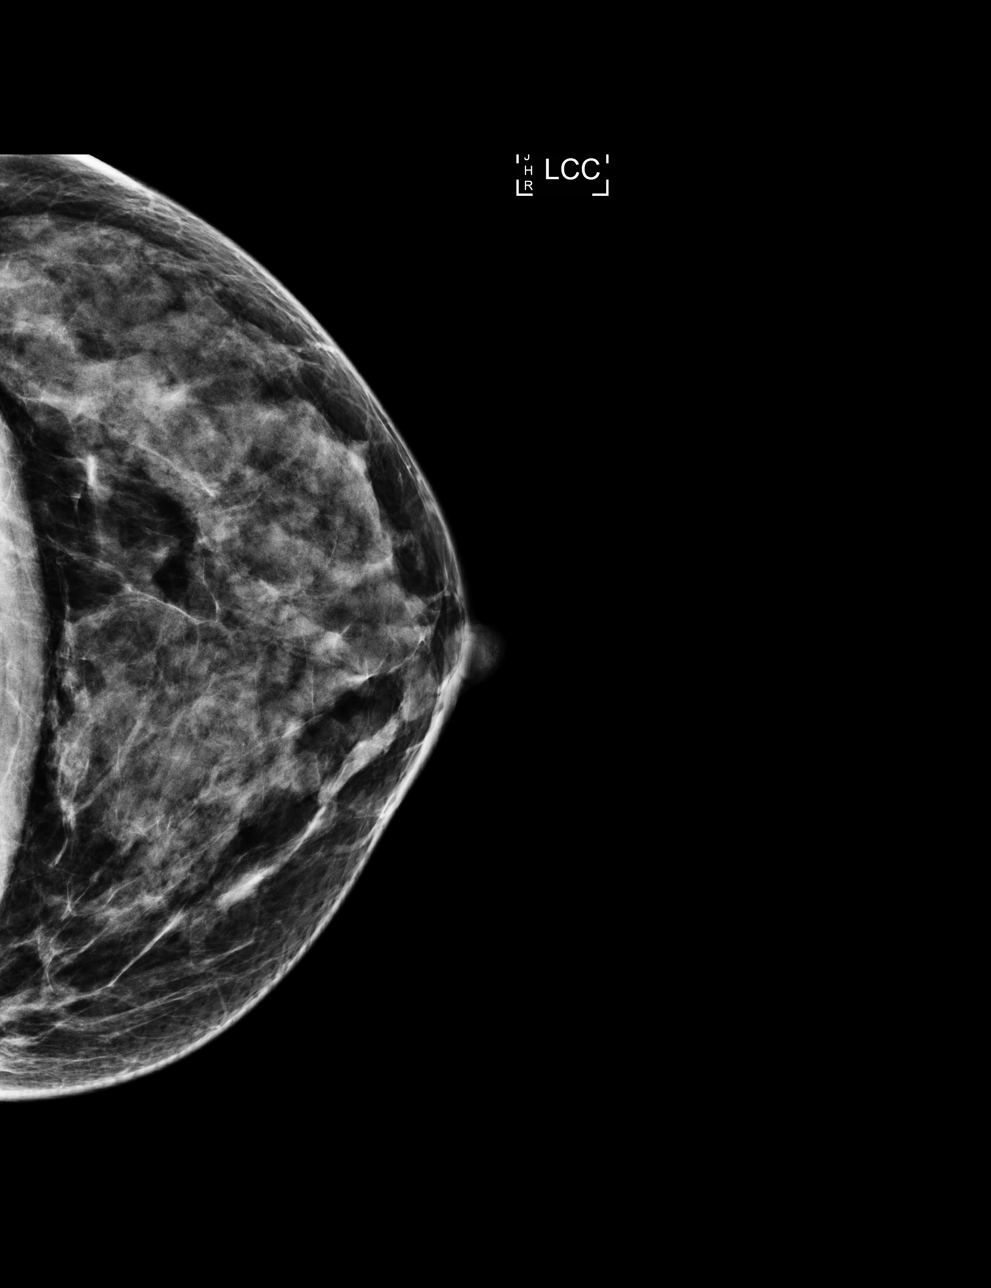

[R MLO]
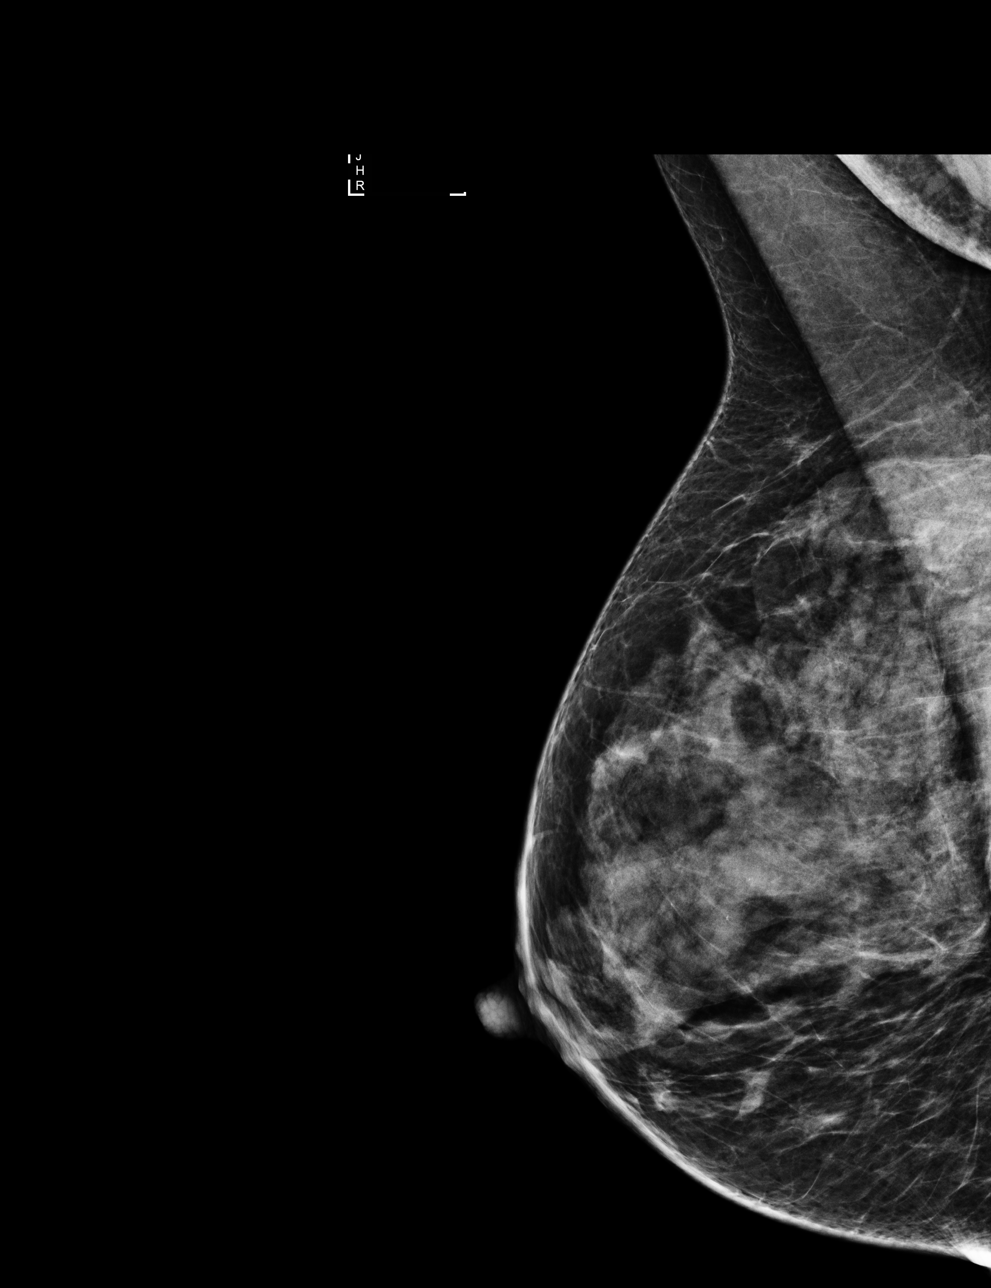

[R CC]
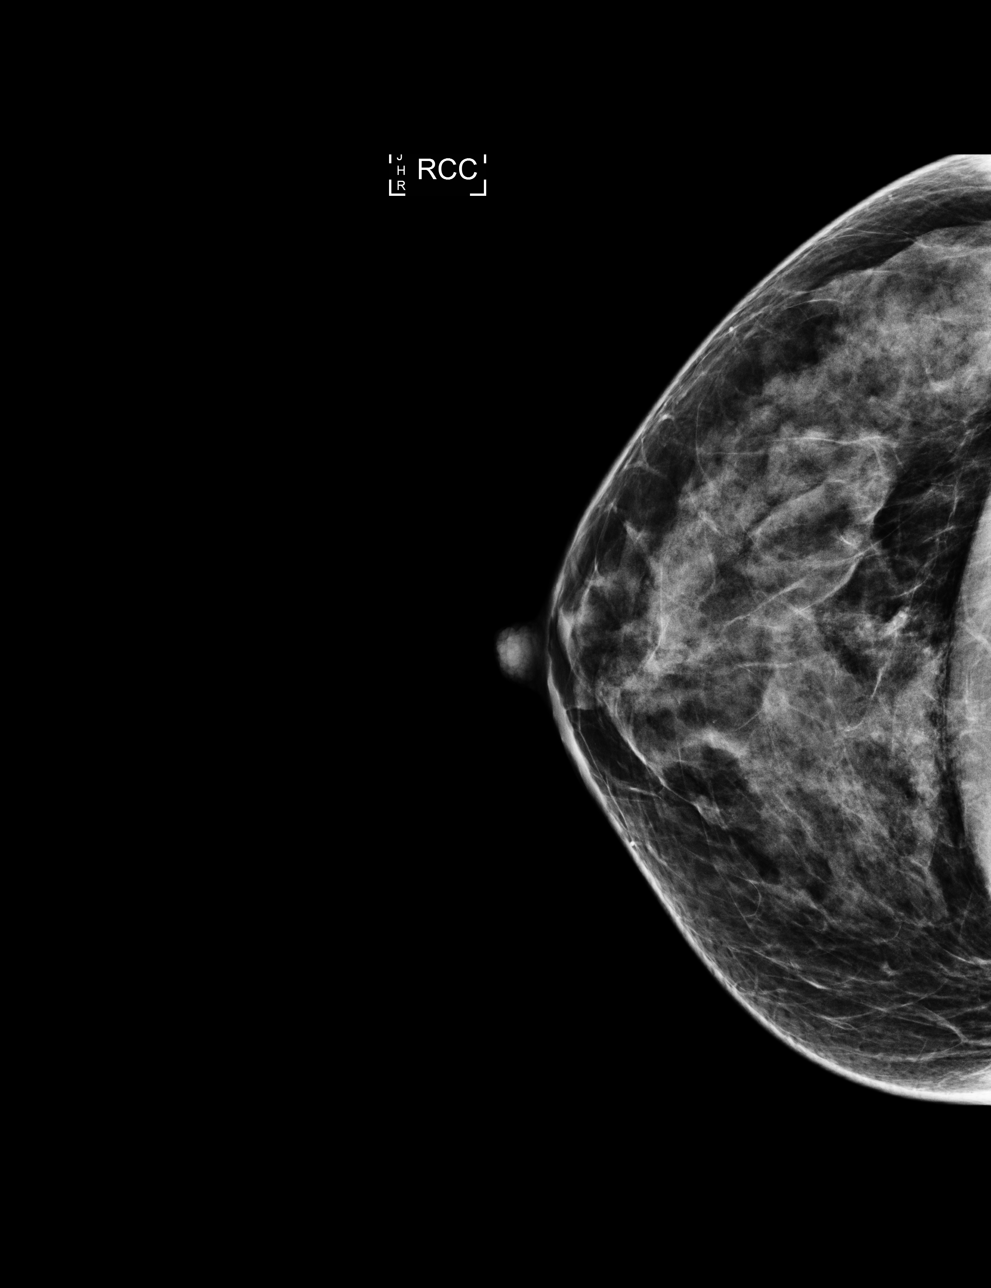

[L MLO]
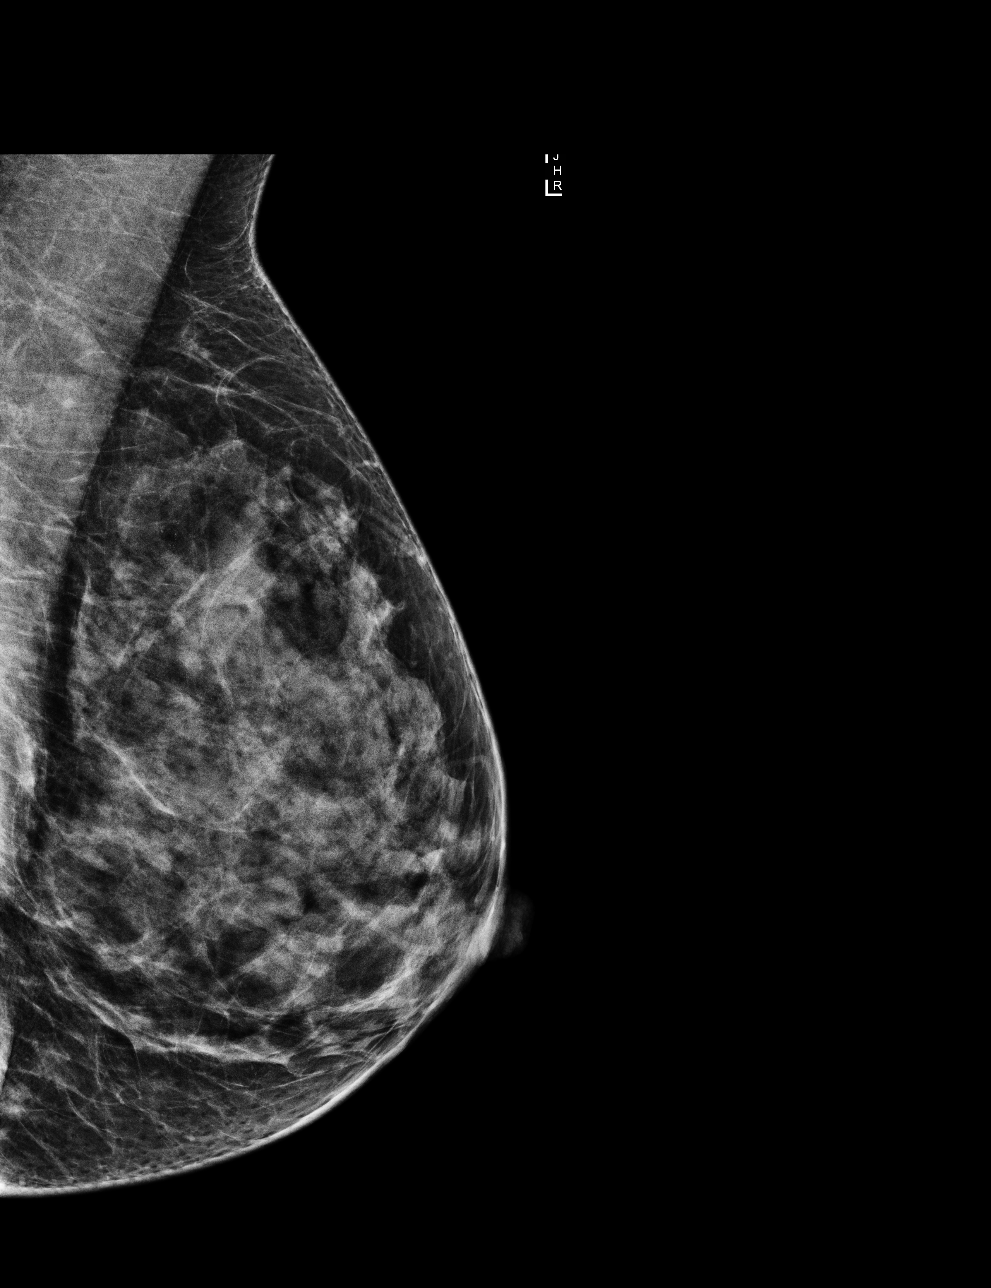

[4 of 4 positions shown; findings below may reference images not displayed]

ACR Breast Density Category c: The breast tissue is heterogeneously
dense, which may obscure small masses.
FINDINGS: There are no findings suspicious for malignancy. Images were
processed with CAD.
IMPRESSION: No mammographic evidence of malignancy. A result letter of this
screening mammogram will be mailed directly to the patient.

RECOMMENDATION:
Screening mammogram in one year. (Code:[id number])

BI-RADS CATEGORY  1: Negative.

## 2019-08-22 DIAGNOSIS — F4320 Adjustment disorder, unspecified: Secondary | ICD-10-CM | POA: Diagnosis not present

## 2019-09-03 DIAGNOSIS — F4320 Adjustment disorder, unspecified: Secondary | ICD-10-CM | POA: Diagnosis not present

## 2019-09-09 DIAGNOSIS — K080 Exfoliation of teeth due to systemic causes: Secondary | ICD-10-CM | POA: Diagnosis not present

## 2019-09-17 DIAGNOSIS — F4320 Adjustment disorder, unspecified: Secondary | ICD-10-CM | POA: Diagnosis not present

## 2019-10-03 DIAGNOSIS — F411 Generalized anxiety disorder: Secondary | ICD-10-CM | POA: Diagnosis not present

## 2019-10-03 DIAGNOSIS — F418 Other specified anxiety disorders: Secondary | ICD-10-CM | POA: Diagnosis not present

## 2019-10-03 DIAGNOSIS — F4320 Adjustment disorder, unspecified: Secondary | ICD-10-CM | POA: Diagnosis not present

## 2019-10-03 DIAGNOSIS — E039 Hypothyroidism, unspecified: Secondary | ICD-10-CM | POA: Diagnosis not present

## 2019-10-03 DIAGNOSIS — G43009 Migraine without aura, not intractable, without status migrainosus: Secondary | ICD-10-CM | POA: Diagnosis not present

## 2019-10-16 DIAGNOSIS — F4320 Adjustment disorder, unspecified: Secondary | ICD-10-CM | POA: Diagnosis not present

## 2019-10-31 DIAGNOSIS — F4320 Adjustment disorder, unspecified: Secondary | ICD-10-CM | POA: Diagnosis not present

## 2019-11-20 DIAGNOSIS — F4320 Adjustment disorder, unspecified: Secondary | ICD-10-CM | POA: Diagnosis not present

## 2019-12-18 DIAGNOSIS — F4320 Adjustment disorder, unspecified: Secondary | ICD-10-CM | POA: Diagnosis not present

## 2020-01-10 ENCOUNTER — Other Ambulatory Visit: Payer: Self-pay | Admitting: *Deleted

## 2020-01-10 MED ORDER — TRETINOIN 0.025 % EX CREA: TOPICAL_CREAM | Freq: Every evening | CUTANEOUS | 3 refills | Status: DC

## 2020-01-10 MED ORDER — SPIRONOLACTONE 50 MG PO TABS: 50.0000 mg | ORAL_TABLET | Freq: Two times a day (BID) | ORAL | 3 refills | Status: DC

## 2020-01-14 DIAGNOSIS — F4320 Adjustment disorder, unspecified: Secondary | ICD-10-CM | POA: Diagnosis not present

## 2020-01-29 DIAGNOSIS — F4320 Adjustment disorder, unspecified: Secondary | ICD-10-CM | POA: Diagnosis not present

## 2020-02-12 DIAGNOSIS — F4320 Adjustment disorder, unspecified: Secondary | ICD-10-CM | POA: Diagnosis not present

## 2020-02-19 DIAGNOSIS — F411 Generalized anxiety disorder: Secondary | ICD-10-CM | POA: Diagnosis not present

## 2020-02-19 DIAGNOSIS — F1021 Alcohol dependence, in remission: Secondary | ICD-10-CM | POA: Diagnosis not present

## 2020-02-19 DIAGNOSIS — Z23 Encounter for immunization: Secondary | ICD-10-CM | POA: Diagnosis not present

## 2020-02-19 DIAGNOSIS — R5383 Other fatigue: Secondary | ICD-10-CM | POA: Diagnosis not present

## 2020-02-19 DIAGNOSIS — F418 Other specified anxiety disorders: Secondary | ICD-10-CM | POA: Diagnosis not present

## 2020-02-19 DIAGNOSIS — E039 Hypothyroidism, unspecified: Secondary | ICD-10-CM | POA: Diagnosis not present

## 2020-02-25 DIAGNOSIS — F4320 Adjustment disorder, unspecified: Secondary | ICD-10-CM | POA: Diagnosis not present

## 2020-03-06 DIAGNOSIS — Z1211 Encounter for screening for malignant neoplasm of colon: Secondary | ICD-10-CM | POA: Diagnosis not present

## 2020-03-06 DIAGNOSIS — R140 Abdominal distension (gaseous): Secondary | ICD-10-CM | POA: Diagnosis not present

## 2020-03-06 DIAGNOSIS — K529 Noninfective gastroenteritis and colitis, unspecified: Secondary | ICD-10-CM | POA: Diagnosis not present

## 2020-03-11 DIAGNOSIS — F4320 Adjustment disorder, unspecified: Secondary | ICD-10-CM | POA: Diagnosis not present

## 2020-04-02 DIAGNOSIS — F4320 Adjustment disorder, unspecified: Secondary | ICD-10-CM | POA: Diagnosis not present

## 2020-04-22 DIAGNOSIS — F4320 Adjustment disorder, unspecified: Secondary | ICD-10-CM | POA: Diagnosis not present

## 2020-04-27 DIAGNOSIS — Z01812 Encounter for preprocedural laboratory examination: Secondary | ICD-10-CM | POA: Diagnosis not present

## 2020-05-06 DIAGNOSIS — F4320 Adjustment disorder, unspecified: Secondary | ICD-10-CM | POA: Diagnosis not present

## 2020-05-18 DIAGNOSIS — F4320 Adjustment disorder, unspecified: Secondary | ICD-10-CM | POA: Diagnosis not present

## 2020-06-01 DIAGNOSIS — F4320 Adjustment disorder, unspecified: Secondary | ICD-10-CM | POA: Diagnosis not present

## 2020-06-16 DIAGNOSIS — E039 Hypothyroidism, unspecified: Secondary | ICD-10-CM | POA: Diagnosis not present

## 2020-06-18 DIAGNOSIS — F4320 Adjustment disorder, unspecified: Secondary | ICD-10-CM | POA: Diagnosis not present

## 2020-07-01 DIAGNOSIS — F4320 Adjustment disorder, unspecified: Secondary | ICD-10-CM | POA: Diagnosis not present

## 2020-07-15 DIAGNOSIS — F4320 Adjustment disorder, unspecified: Secondary | ICD-10-CM | POA: Diagnosis not present

## 2020-07-30 DIAGNOSIS — F4320 Adjustment disorder, unspecified: Secondary | ICD-10-CM | POA: Diagnosis not present

## 2020-08-07 DIAGNOSIS — Z681 Body mass index (BMI) 19 or less, adult: Secondary | ICD-10-CM | POA: Diagnosis not present

## 2020-08-07 DIAGNOSIS — N915 Oligomenorrhea, unspecified: Secondary | ICD-10-CM | POA: Diagnosis not present

## 2020-08-07 DIAGNOSIS — N760 Acute vaginitis: Secondary | ICD-10-CM | POA: Diagnosis not present

## 2020-08-07 DIAGNOSIS — Z01419 Encounter for gynecological examination (general) (routine) without abnormal findings: Secondary | ICD-10-CM | POA: Diagnosis not present

## 2020-08-07 DIAGNOSIS — G43909 Migraine, unspecified, not intractable, without status migrainosus: Secondary | ICD-10-CM | POA: Insufficient documentation

## 2020-08-10 DIAGNOSIS — F4320 Adjustment disorder, unspecified: Secondary | ICD-10-CM | POA: Diagnosis not present

## 2020-08-19 DIAGNOSIS — Z23 Encounter for immunization: Secondary | ICD-10-CM | POA: Diagnosis not present

## 2020-08-19 DIAGNOSIS — R5383 Other fatigue: Secondary | ICD-10-CM | POA: Diagnosis not present

## 2020-08-19 DIAGNOSIS — E039 Hypothyroidism, unspecified: Secondary | ICD-10-CM | POA: Diagnosis not present

## 2020-08-19 DIAGNOSIS — Z0000 Encounter for general adult medical examination without abnormal findings: Secondary | ICD-10-CM | POA: Diagnosis not present

## 2020-08-19 DIAGNOSIS — Z13220 Encounter for screening for lipoid disorders: Secondary | ICD-10-CM | POA: Diagnosis not present

## 2020-08-19 DIAGNOSIS — G43009 Migraine without aura, not intractable, without status migrainosus: Secondary | ICD-10-CM | POA: Diagnosis not present

## 2020-08-19 DIAGNOSIS — F418 Other specified anxiety disorders: Secondary | ICD-10-CM | POA: Diagnosis not present

## 2020-08-26 DIAGNOSIS — F4320 Adjustment disorder, unspecified: Secondary | ICD-10-CM | POA: Diagnosis not present

## 2020-09-16 DIAGNOSIS — F4320 Adjustment disorder, unspecified: Secondary | ICD-10-CM | POA: Diagnosis not present

## 2020-09-17 ENCOUNTER — Other Ambulatory Visit: Payer: Federal, State, Local not specified - PPO | Admitting: Internal Medicine

## 2020-09-24 ENCOUNTER — Ambulatory Visit: Payer: Self-pay | Admitting: Internal Medicine

## 2020-10-07 DIAGNOSIS — F4320 Adjustment disorder, unspecified: Secondary | ICD-10-CM | POA: Diagnosis not present

## 2020-10-28 DIAGNOSIS — F4320 Adjustment disorder, unspecified: Secondary | ICD-10-CM | POA: Diagnosis not present

## 2020-11-12 DIAGNOSIS — F4320 Adjustment disorder, unspecified: Secondary | ICD-10-CM | POA: Diagnosis not present

## 2020-12-03 ENCOUNTER — Other Ambulatory Visit: Payer: Federal, State, Local not specified - PPO | Admitting: Internal Medicine

## 2020-12-03 ENCOUNTER — Other Ambulatory Visit: Payer: Self-pay

## 2020-12-03 DIAGNOSIS — Z13220 Encounter for screening for lipoid disorders: Secondary | ICD-10-CM | POA: Diagnosis not present

## 2020-12-03 DIAGNOSIS — Z0000 Encounter for general adult medical examination without abnormal findings: Secondary | ICD-10-CM

## 2020-12-03 DIAGNOSIS — Z1321 Encounter for screening for nutritional disorder: Secondary | ICD-10-CM

## 2020-12-03 DIAGNOSIS — Z1329 Encounter for screening for other suspected endocrine disorder: Secondary | ICD-10-CM

## 2020-12-03 LAB — CBC WITH DIFFERENTIAL/PLATELET
Absolute Monocytes: 465 cells/uL (ref 200–950)
Basophils Absolute: 40 cells/uL (ref 0–200)
Basophils Relative: 0.8 %
Eosinophils Absolute: 110 cells/uL (ref 15–500)
Eosinophils Relative: 2.2 %
HCT: 39.5 % (ref 35.0–45.0)
Hemoglobin: 13.3 g/dL (ref 11.7–15.5)
Lymphs Abs: 2415 cells/uL (ref 850–3900)
MCH: 29.3 pg (ref 27.0–33.0)
MCHC: 33.7 g/dL (ref 32.0–36.0)
MCV: 87 fL (ref 80.0–100.0)
MPV: 10.9 fL (ref 7.5–12.5)
Monocytes Relative: 9.3 %
Neutro Abs: 1970 cells/uL (ref 1500–7800)
Neutrophils Relative %: 39.4 %
Platelets: 249 10*3/uL (ref 140–400)
RBC: 4.54 10*6/uL (ref 3.80–5.10)
RDW: 12.8 % (ref 11.0–15.0)
Total Lymphocyte: 48.3 %
WBC: 5 10*3/uL (ref 3.8–10.8)

## 2020-12-03 LAB — COMPLETE METABOLIC PANEL WITH GFR
AG Ratio: 2.4 (calc) (ref 1.0–2.5)
ALT: 19 U/L (ref 6–29)
AST: 18 U/L (ref 10–35)
Albumin: 4.8 g/dL (ref 3.6–5.1)
Alkaline phosphatase (APISO): 65 U/L (ref 31–125)
BUN: 15 mg/dL (ref 7–25)
CO2: 30 mmol/L (ref 20–32)
Calcium: 9.9 mg/dL (ref 8.6–10.2)
Chloride: 106 mmol/L (ref 98–110)
Creat: 0.81 mg/dL (ref 0.50–0.99)
Globulin: 2 g/dL (calc) (ref 1.9–3.7)
Glucose, Bld: 86 mg/dL (ref 65–99)
Potassium: 4.4 mmol/L (ref 3.5–5.3)
Sodium: 145 mmol/L (ref 135–146)
Total Bilirubin: 0.6 mg/dL (ref 0.2–1.2)
Total Protein: 6.8 g/dL (ref 6.1–8.1)
eGFR: 91 mL/min/{1.73_m2} (ref >=60)

## 2020-12-03 LAB — LIPID PANEL
Cholesterol: 173 mg/dL (ref <200)
HDL: 68 mg/dL (ref >=50)
LDL Cholesterol (Calc): 93 mg/dL (calc)
Non-HDL Cholesterol (Calc): 105 mg/dL (calc) (ref <130)
Total CHOL/HDL Ratio: 2.5 (calc) (ref <5.0)
Triglycerides: 41 mg/dL (ref <150)

## 2020-12-03 LAB — TSH: TSH: 0.54 mIU/L

## 2020-12-04 ENCOUNTER — Ambulatory Visit: Payer: Federal, State, Local not specified - PPO | Admitting: Internal Medicine

## 2020-12-04 ENCOUNTER — Encounter: Payer: Self-pay | Admitting: Internal Medicine

## 2020-12-04 VITALS — BP 100/80 | HR 78 | Ht 69.0 in | Wt 128.3 lb

## 2020-12-04 DIAGNOSIS — Z1231 Encounter for screening mammogram for malignant neoplasm of breast: Secondary | ICD-10-CM | POA: Diagnosis not present

## 2020-12-04 DIAGNOSIS — F411 Generalized anxiety disorder: Secondary | ICD-10-CM | POA: Diagnosis not present

## 2020-12-04 DIAGNOSIS — E039 Hypothyroidism, unspecified: Secondary | ICD-10-CM | POA: Diagnosis not present

## 2020-12-04 DIAGNOSIS — R197 Diarrhea, unspecified: Secondary | ICD-10-CM | POA: Diagnosis not present

## 2020-12-04 DIAGNOSIS — N912 Amenorrhea, unspecified: Secondary | ICD-10-CM

## 2020-12-04 DIAGNOSIS — Z8669 Personal history of other diseases of the nervous system and sense organs: Secondary | ICD-10-CM

## 2020-12-04 DIAGNOSIS — Z0000 Encounter for general adult medical examination without abnormal findings: Secondary | ICD-10-CM | POA: Diagnosis not present

## 2020-12-04 LAB — POCT URINALYSIS DIPSTICK
Appearance: NEGATIVE
Bilirubin, UA: NEGATIVE
Blood, UA: NEGATIVE
Glucose, UA: NEGATIVE
Ketones, UA: NEGATIVE
Leukocytes, UA: NEGATIVE
Nitrite, UA: NEGATIVE
Odor: NEGATIVE
Protein, UA: NEGATIVE
Spec Grav, UA: 1.01 (ref 1.010–1.025)
Urobilinogen, UA: 0.2 E.U./dL
pH, UA: 6.5 (ref 5.0–8.0)

## 2020-12-04 MED ORDER — LEVOTHYROXINE SODIUM 75 MCG PO TABS: 75.0000 ug | ORAL_TABLET | Freq: Every day | ORAL | 0 refills | Status: DC

## 2020-12-04 NOTE — Progress Notes (Addendum)
   Subjective:    Patient ID: Jennifer Scott, female    DOB: 03/25/1976, 45 y.o.   MRN: [id number]  HPI First visit for this pleasant 45 year old Female referred by Jennifer Scott.  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  She resides in [place].  She is single.  She has a Education officer, community.  Does not smoke or consume alcohol.    Family history: Father with history of diabetes, memory loss, heart issues and hypertension.  Mother with history of headaches.  Brother age 47 with history of hypertension.  Patient continues to have menstrual periods and last period was July 2021.  She has GYN physician.  Apparent history of hypothyroidism and has been on Armour Thyroid daily.  History of migraine headaches treated with trazodone, Imitrex.  Also has taken spironolactone twice daily.  Had D&C for cervical polyp around 2015.   Social history: She is single.  Completed 4 years of college.  Enjoys her job.      Review of Systems-has issues with diarrhea and she is concerned about inflammatory bowel disease.  Will refer to Gastroenterology.  May be  irritable bowel syndrome rather than than inflammatory bowel disease.  Also due for screening colonoscopy.     Objective:   Physical Exam Blood pressure 100/80 pulse 78 pulse oximetry 98% weight 128 pounds 5 ounces height 5 feet 9 inches BMI 18.95  Skin: Warm and dry.  No cervical adenopathy or thyromegaly.  Chest is clear to auscultation.  Breast are without masses.  Cardiac exam: Regular rate and rhythm without ectopy.  Abdomen is soft nondistended without hepatosplenomegaly masses or tenderness.  GYN exam deferred to gynecologist.  No lower extremity edema or deformity.  Neuro is intact without focal deficits.  Affect thought and judgment are normal.       Assessment & Plan:  History of migraine headaches-have better control of these given her stressful job.  She does take Imitrex and sometimes trazodone and  is on Effexor.  I would like for Dr. Jaynee Scott to evaluate her.  She is now 45 years old and would like to have screening colonoscopy.  She also has some issues with diarrhea.  Will be referred to Gastroenterology.  Night sweats-She will discuss with gynecologist  Insomnia-likely  job stress related and may trigger migraines  Her labs are reviewed and are entirely normal.  Including TSH, CBC and c-Met as well as lipid panel.  Vitamin D was not done due to expense.  Plan: Her TSH is low normal at 0.54.  We will repeat that when she returns in early October.  I am going to give her Bentyl to try twice daily to see if this helps diarrhea.  FODMAP diet recommended for possible irritable bowel syndrome.  Switching from Armour Thyroid to levothyroxine with follow-up in 6 weeks.  FSH was not added as requested so we will draw this with TSH when she returns in October.

## 2020-12-04 NOTE — Patient Instructions (Addendum)
It was a pleasure to see you today. Referral to Dr. Jaynee Eagles regarding migraine management. Referral to GI regarding diarrhea and screening colonoscopy. Try Bentyl twice daily before meals to see if this helps diarrhea. Avoid spicy foods, fried foods, dairy products if they bother you  ( FODMAP diet) Follow up in 6 weeks to check on generic dose of Levothyroxine for hypothyroidism.  Check FSH to she if menopausal

## 2020-12-21 DIAGNOSIS — F4320 Adjustment disorder, unspecified: Secondary | ICD-10-CM | POA: Diagnosis not present

## 2021-01-09 ENCOUNTER — Encounter: Payer: Self-pay | Admitting: Internal Medicine

## 2021-01-18 ENCOUNTER — Other Ambulatory Visit: Payer: Federal, State, Local not specified - PPO | Admitting: Internal Medicine

## 2021-01-18 ENCOUNTER — Other Ambulatory Visit: Payer: Self-pay

## 2021-01-18 DIAGNOSIS — E039 Hypothyroidism, unspecified: Secondary | ICD-10-CM

## 2021-01-18 LAB — TSH: TSH: 0.41 mIU/L

## 2021-01-19 ENCOUNTER — Encounter: Payer: Self-pay | Admitting: Internal Medicine

## 2021-01-19 ENCOUNTER — Ambulatory Visit: Payer: Federal, State, Local not specified - PPO | Admitting: Internal Medicine

## 2021-01-19 VITALS — BP 100/68 | HR 79 | Temp 97.7°F | Ht 69.0 in | Wt 126.0 lb

## 2021-01-19 DIAGNOSIS — N912 Amenorrhea, unspecified: Secondary | ICD-10-CM

## 2021-01-19 DIAGNOSIS — E039 Hypothyroidism, unspecified: Secondary | ICD-10-CM | POA: Diagnosis not present

## 2021-01-19 DIAGNOSIS — Z23 Encounter for immunization: Secondary | ICD-10-CM

## 2021-01-19 DIAGNOSIS — R197 Diarrhea, unspecified: Secondary | ICD-10-CM

## 2021-01-19 DIAGNOSIS — N951 Menopausal and female climacteric states: Secondary | ICD-10-CM

## 2021-01-19 DIAGNOSIS — Z8669 Personal history of other diseases of the nervous system and sense organs: Secondary | ICD-10-CM

## 2021-01-19 LAB — FSH/LH
FSH: 121.5 m[IU]/mL — ABNORMAL HIGH
LH: 66.4 m[IU]/mL

## 2021-01-19 MED ORDER — RIZATRIPTAN BENZOATE 10 MG PO TABS: 10.0000 mg | ORAL_TABLET | ORAL | 2 refills | Status: DC | PRN

## 2021-01-19 MED ORDER — LEVOTHYROXINE SODIUM 50 MCG PO TABS: 50.0000 ug | ORAL_TABLET | Freq: Every day | ORAL | 0 refills | Status: DC

## 2021-01-19 NOTE — Progress Notes (Signed)
   Subjective:    Patient ID: Jennifer Scott, female    DOB: 1975-04-24, 45 y.o.   MRN: [id number]  HPI 45 year old Female seen for follow up. Seen for the first time on August 19th. Hx of hypothyroidism.  Had been taking Armour Thyroid.  At last visit was started on levothyroxine 50 mcg daily.  TSH is 0.41.  FSH is consistent with menopause at 121.5.  She will see gynecologist.  Is interested in estrogen replacement therapy.  Migraines continue to be an issue for her.  Will refer to Dr. Hulda Humphrey.    Review of Systems upcoming appointment with gastroenterologist regarding diarrhea.     Objective:   Physical Exam  Vital signs reviewed.  No thyromegaly.  Chest clear to auscultation.  Cardiac exam regular rate and rhythm.      Assessment & Plan:   Hypothyroidism-TSH stable on levothyroxine instead of Armour Thyroid 50 mcg daily.  Prefer that she continue with levothyroxine instead of Armour Thyroid.  Recheck TSH before next appointment.  Would like TSH to be around 1.00.  May need lower dose of levothyroxine.  Elevated FSH consistent with menopause-consider referral to gynecologist  Migraine headaches-try Maxalt instead of Imitrex for migraine headaches.  Consider referral to Dr. Jaynee Eagles if not responding.  [place] gastroenterologist in the near future.?  Irritable bowel syndrome versus inflammatory bowel disease.  Health maintenance-flu vaccine given

## 2021-01-19 NOTE — Patient Instructions (Addendum)
Try Maxalt instead of Imitrex for migraines. Flu vaccine given. Based on your labs, it would appear you are menopausal.  May need to see gynecologist for management.  Seeing gastroenterologist in the near future regarding diarrhea.

## 2021-01-20 DIAGNOSIS — F4320 Adjustment disorder, unspecified: Secondary | ICD-10-CM | POA: Diagnosis not present

## 2021-01-28 ENCOUNTER — Encounter: Payer: Self-pay | Admitting: Gastroenterology

## 2021-01-28 ENCOUNTER — Ambulatory Visit (INDEPENDENT_AMBULATORY_CARE_PROVIDER_SITE_OTHER): Payer: Federal, State, Local not specified - PPO | Admitting: Gastroenterology

## 2021-01-28 VITALS — BP 110/78 | HR 68 | Ht 69.0 in | Wt 127.4 lb

## 2021-01-28 DIAGNOSIS — R197 Diarrhea, unspecified: Secondary | ICD-10-CM

## 2021-01-28 NOTE — Patient Instructions (Addendum)
If you are age 46 or younger, your body mass index should be between 19-25. Your Body mass index is 18.81 kg/m. If this is out of the aformentioned range listed, please consider follow up with your Primary Care Provider.  _________________________________________________________  The [place] providers would like to encourage you to use [facility] to communicate with providers for non-urgent requests or questions.  Due to long hold times on the telephone, sending your provider a message by [facility] may be a faster and more efficient way to get a response.  Please allow 48 business hours for a response.  Please remember that this is for non-urgent requests.   You have been scheduled for a colonoscopy. Please follow written instructions given to you at your visit today.  Please pick up your prep supplies at the pharmacy within the next 1-3 days. If you use inhalers (even only as needed), please bring them with you on the day of your procedure.  Due to recent changes in healthcare laws, you may see the results of your imaging and laboratory studies on MyChart before your provider has had a chance to review them.  We understand that in some cases there may be results that are confusing or concerning to you. Not all laboratory results come back in the same time frame and the provider may be waiting for multiple results in order to interpret others.  Please give [place] 48 hours in order for your provider to thoroughly review all the results before contacting the office for clarification of your results.   Thank you for entrusting me with your care and choosing [facility].  Dr Tarri Glenn

## 2021-01-28 NOTE — Progress Notes (Signed)
Referring Provider: Elby Showers, MD Primary Care Physician:  Elby Showers, MD   Reason for Consultation:  Diarrhea   IMPRESSION:  Chronic diarrhea without prior evaluation No prior colon cancer screening History of IBS  The differential diagnosis of chronic diarrhea without alarm features includes: irritable bowel syndrome, IBD, missed infection (such as giardia), food intolerance, microscopic colitis, other functional GI disease. She has been previously tested for celiac. TSH testing has been normal.    PLAN: - Colonoscopy with random colon biopsies - Obtain Giardia testing if colonoscopy is negative - Avoid milk products, raw fruits, raw vegetables, high fat foods, artifical sweeteners, and carbonated beverages   I consented the patient discussing the risks, benefits, and alternatives to endoscopic evaluation. In particular, we discussed the risks that include, but are not limited to, reaction to medication, cardiopulmonary compromise, bleeding requiring blood transfusion, aspiration resulting in pneumonia, perforation requiring surgery, lack of diagnosis, severe illness requiring hospitalization, and even death. The patient acknowledges these risks and asks that we proceed.   HPI: Jennifer Scott is a 45 y.o. female self-referred for diarrhea. Dr. Renold Genta referred her for colon cancer screening. At the time of scheduling, she reported chronic diarrhea to our staff, who recommended that she had an office visit prior to scheduling her colonoscopy. The history is obtained through the patient and review of her electronic health record. She has a anxiety, chronic headaches, hypothyroidism, and IBS. She has a history of alcoholism. She works as a Engineer, manufacturing systems.   Reports intermittent diarrhea with associated flatus for at least 10-20 years. Diarrhea is further described as a poorly formed stool every day.  Never has a formed bowel movement. She is able to live with the symptoms  but finds them bothersome. No associated abdominal pain or cramping. No blood or mucous in the stool. No constipation.   Seen by a GI in [place] several years ago (can't remember the name) who screened for celiac disease by labs. Testing was negative.  Dietary recommendations were made but she didn't follow-through.   Diagnosed with IBS by her PCP at [facility].   Drinks several sodas a day. Rare sugar free sodas. No foods that trigger diarrhea.   She has not tried any medication or diet changes to manage her symptoms.  Denies a precipitating event, trauma, close contacts with similar symptoms, changes in diet, recent travel or antibiotic use.    No family history of colon polyps or cancer. Father with type 2 diabetes. No family history of autoimmune disease or colitis.   Labs 12/03/20: normal CMP and CBC. Normal TSH. Labs 01/18/21: normal TSH Past Medical History:  Diagnosis Date   Medical history non-contributory     Past Surgical History:  Procedure Laterality Date   CERVICAL POLYPECTOMY N/A 10/10/2012   Procedure: CERVICAL POLYPECTOMY;  Surgeon: Linda Hedges, DO;  Location: [facility];  Service: Gynecology;  Laterality: N/A;   HYSTEROSCOPY WITH D & C N/A 10/10/2012   Procedure: DILATATION AND CURETTAGE /HYSTEROSCOPY;  Surgeon: Linda Hedges, DO;  Location: [facility];  Service: Gynecology;  Laterality: N/A;   WISDOM TOOTH EXTRACTION       Current Outpatient Medications  Medication Sig Dispense Refill   atomoxetine (STRATTERA) 18 MG capsule Take 18 mg by mouth daily.     levothyroxine (SYNTHROID) 50 MCG tablet Take 1 tablet (50 mcg total) by mouth daily. 90 tablet 0   propranolol (INDERAL) 10 MG tablet Take 10 mg by mouth 2 (two) times daily.  rizatriptan (MAXALT) 10 MG tablet Take 1 tablet (10 mg total) by mouth as needed for migraine. May repeat in 2 hours if needed 10 tablet 2   traZODone (DESYREL) 50 MG tablet Take 50-100 mg by mouth at bedtime.     venlafaxine XR (EFFEXOR-XR) 75 MG 24  hr capsule Take 150 mg by mouth daily.     No current facility-administered medications for this visit.    Allergies as of 01/28/2021   (No Known Allergies)    Family History  Problem Relation Age of Onset   Migraines Mother    Heart disease Father    Alzheimer's disease Father    Hypertension Father    Diabetes Father    Thyroid disease Father    Hypertension Brother    Cancer Paternal Grandmother    Cancer Paternal Grandfather    Colon cancer Neg Hx    Colon polyps Neg Hx      Review of Systems: 12 system ROS is negative except as noted above with the addition of anxiety, fatigue, headaches, night sweats, insomnia, and excessive thirst.   Physical Exam: General:   Alert,  well-nourished, pleasant and cooperative in NAD Head:  Normocephalic and atraumatic. Eyes:  Sclera clear, no icterus.   Conjunctiva pink. Ears:  Normal auditory acuity. Nose:  No deformity, discharge,  or lesions. Mouth:  No deformity or lesions.   Neck:  Supple; no masses or thyromegaly. Lungs:  Clear throughout to auscultation.   No wheezes. Heart:  Regular rate and rhythm; no murmurs. Abdomen:  Soft, thin, nontender, nondistended, normal bowel sounds, no rebound or guarding. No hepatosplenomegaly.   Rectal:  Deferred  Msk:  Symmetrical. No boney deformities LAD: No inguinal or umbilical LAD Extremities:  No clubbing or edema. Neurologic:  Alert and  oriented x4;  grossly nonfocal Skin:  Intact without significant lesions or rashes. Psych:  Alert and cooperative. Normal mood and affect.    Aidah Forquer L. Tarri Glenn, MD, MPH 01/28/2021, 7:39 PM

## 2021-02-01 ENCOUNTER — Encounter: Payer: Federal, State, Local not specified - PPO | Admitting: Gastroenterology

## 2021-02-03 ENCOUNTER — Encounter: Payer: Self-pay | Admitting: Gastroenterology

## 2021-02-03 ENCOUNTER — Ambulatory Visit (AMBULATORY_SURGERY_CENTER): Payer: Federal, State, Local not specified - PPO | Admitting: Gastroenterology

## 2021-02-03 ENCOUNTER — Other Ambulatory Visit: Payer: Self-pay

## 2021-02-03 VITALS — BP 96/69 | HR 64 | Temp 97.5°F | Resp 15 | Ht 69.0 in | Wt 127.0 lb

## 2021-02-03 DIAGNOSIS — R197 Diarrhea, unspecified: Secondary | ICD-10-CM | POA: Diagnosis not present

## 2021-02-03 DIAGNOSIS — D120 Benign neoplasm of cecum: Secondary | ICD-10-CM | POA: Diagnosis not present

## 2021-02-03 MED ORDER — SODIUM CHLORIDE 0.9 % IV SOLN: 500.0000 mL | Freq: Once | INTRAVENOUS | Status: DC

## 2021-02-03 NOTE — Progress Notes (Signed)
   Referring Provider: Elby Showers, MD Primary Care Physician:  Elby Showers, MD  Reason for Procedure:  Diarrhea   IMPRESSION:  Chronic diarrhea No prior colon cancer screening Appropriate candidate for monitored anesthesia care  PLAN: Colonoscopy in the [place] today   HPI: Jennifer Scott is a 45 y.o. female presents for colonoscopy to evaluate diarrhea. Please see my office note from 01/28/21 for complete details. No change in history or physical exam since that time.    Past Medical History:  Diagnosis Date   Anxiety    Medical history non-contributory    Migraines    Substance abuse ([place])     Past Surgical History:  Procedure Laterality Date   CERVICAL POLYPECTOMY N/A 10/10/2012   Procedure: CERVICAL POLYPECTOMY;  Surgeon: Linda Hedges, DO;  Location: [facility];  Service: Gynecology;  Laterality: N/A;   HYSTEROSCOPY WITH D & C N/A 10/10/2012   Procedure: DILATATION AND CURETTAGE /HYSTEROSCOPY;  Surgeon: Linda Hedges, DO;  Location: [facility];  Service: Gynecology;  Laterality: N/A;   WISDOM TOOTH EXTRACTION      Current Outpatient Medications  Medication Sig Dispense Refill   atomoxetine (STRATTERA) 18 MG capsule Take 18 mg by mouth daily.     levothyroxine (SYNTHROID) 50 MCG tablet Take 1 tablet (50 mcg total) by mouth daily. 90 tablet 0   traZODone (DESYREL) 50 MG tablet Take 50-100 mg by mouth at bedtime.     venlafaxine XR (EFFEXOR-XR) 75 MG 24 hr capsule Take 150 mg by mouth daily.     propranolol (INDERAL) 10 MG tablet Take 10 mg by mouth 2 (two) times daily. (Patient not taking: Reported on 02/03/2021)     rizatriptan (MAXALT) 10 MG tablet Take 1 tablet (10 mg total) by mouth as needed for migraine. May repeat in 2 hours if needed 10 tablet 2   Current Facility-Administered Medications  Medication Dose Route Frequency Provider Last Rate Last Admin   0.9 %  sodium chloride infusion  500 mL Intravenous Once [place], MD        Allergies as of  02/03/2021   (No Known Allergies)    Family History  Problem Relation Age of Onset   Migraines Mother    Heart disease Father    Alzheimer's disease Father    Hypertension Father    Diabetes Father    Thyroid disease Father    Hypertension Brother    Cancer Paternal Grandmother    Cancer Paternal Grandfather    Colon cancer Neg Hx    Colon polyps Neg Hx      Physical Exam: General:   Alert,  well-nourished, pleasant and cooperative in NAD Head:  Normocephalic and atraumatic. Eyes:  Sclera clear, no icterus.   Conjunctiva pink. Mouth:  No deformity or lesions.   Neck:  Supple; no masses or thyromegaly. Lungs:  Clear throughout to auscultation.   No wheezes. Heart:  Regular rate and rhythm; no murmurs. Abdomen:  Soft, non-tender, nondistended, normal bowel sounds, no rebound or guarding.  Msk:  Symmetrical. No boney deformities LAD: No inguinal or umbilical LAD Extremities:  No clubbing or edema. Neurologic:  Alert and  oriented x4;  grossly nonfocal Skin:  No obvious rash or bruise. Psych:  Alert and cooperative. Normal mood and affect.      Kenya Shiraishi L. Tarri Glenn, MD, MPH 02/03/2021, 2:55 PM

## 2021-02-03 NOTE — Op Note (Signed)
[facility] Patient Name: Jennifer Scott Procedure Date: 02/03/2021 2:59 PM MRN: [id number] Endoscopist: Thornton Park MD, MD Age: 45 Referring MD:  Date of Birth: [birth year] Gender: Female Account #: [id number] Procedure:                Colonoscopy Indications:              Chronic diarrhea                           No prior colon cancer screening Medicines:                Monitored Anesthesia Care Procedure:                Pre-Anesthesia Assessment:                           - Prior to the procedure, a History and Physical                            was performed, and patient medications and                            allergies were reviewed. The patient's tolerance of                            previous anesthesia was also reviewed. The risks                            and benefits of the procedure and the sedation                            options and risks were discussed with the patient.                            All questions were answered, and informed consent                            was obtained. Prior Anticoagulants: The patient has                            taken no previous anticoagulant or antiplatelet                            agents. ASA Grade Assessment: II - A patient with                            mild systemic disease. After reviewing the risks                            and benefits, the patient was deemed in                            satisfactory condition to undergo the procedure.  After obtaining informed consent, the colonoscope                            was passed under direct vision. Throughout the                            procedure, the patient's blood pressure, pulse, and                            oxygen saturations were monitored continuously. The                            Olympus PCF-H190DL [id number]) Colonoscope was                            introduced through the anus and advanced to the the                             cecum, identified by appendiceal orifice and                            ileocecal valve. A second forward view of the right                            colon was performed. The colonoscopy was                            technically difficult and complex due to a                            redundant colon, significant looping and a tortuous                            colon. Successful completion of the procedure was                            aided by changing the patient's position,                            withdrawing and reinserting the scope and applying                            abdominal pressure. The patient tolerated the                            procedure well. The quality of the bowel                            preparation was adequate to identify polyps. 1052mL                            of green liquid was removed during the procedure.  The ileocecal valve, appendiceal orifice, and                            rectum were photographed. Scope In: 3:05:28 PM Scope Out: 3:33:02 PM Scope Withdrawal Time: 0 hours 14 minutes 51 seconds  Total Procedure Duration: 0 hours 27 minutes 34 seconds  Findings:                 The perianal and digital rectal examinations were                            normal.                           A 2 mm polyp was found in the cecum. The polyp was                            sessile. The polyp was removed with a cold snare.                            Resection and retrieval were complete. Estimated                            blood loss was minimal.                           The colon otherwise appeared normal. Biopsies for                            histology were taken with a cold forceps from the                            right colon, left colon and transverse colon for                            evaluation of microscopic colitis. Estimated blood                            loss was minimal.                            A large amount of liquid stool was found in the                            ascending colon and transverse colon. Estimated                            blood loss: none.                           The exam was otherwise without abnormality on                            direct and retroflexion views except for internal  hemorrhoids. Complications:            No immediate complications. Estimated blood loss:                            Minimal. Estimated Blood Loss:     Estimated blood loss was minimal. Impression:               - One 2 mm polyp in the cecum, removed with a cold                            snare. Resected and retrieved.                           - The entire examined colon is normal. Biopsied to                            evaluate for microscopic colitis.                           - Stool in the ascending colon. Fluid aspiration                            performed.                           - Small internal hemorrhoids.                           - The examination was otherwise normal on direct                            and retroflexion views. Recommendation:           - Patient has a contact number available for                            emergencies. The signs and symptoms of potential                            delayed complications were discussed with the                            patient. Return to normal activities tomorrow.                            Written discharge instructions were provided to the                            patient.                           - Resume previous diet.                           - Continue present medications.                           -  Await pathology results.                           - Repeat colonoscopy date to be determined after                            pending pathology results are reviewed for                            surveillance. Use a different bowel prep or two day                             bowel prep in the future.                           - Emerging evidence supports eating a diet of                            fruits, vegetables, grains, calcium, and yogurt                            while reducing red meat and alcohol may reduce the                            risk of colon cancer.                           - Thank you for allowing me to be involved in your                            colon cancer prevention. Thornton Park MD, MD 02/03/2021 3:39:45 PM This report has been signed electronically.

## 2021-02-03 NOTE — Progress Notes (Signed)
Called to room to assist during endoscopic procedure.  Patient ID and intended procedure confirmed with present staff. Received instructions for my participation in the procedure from the performing physician.  

## 2021-02-03 NOTE — Patient Instructions (Signed)
Read all of the handouts given to you by your recovery room nurse.  YOU HAD AN ENDOSCOPIC PROCEDURE TODAY AT [facility]:   Refer to the procedure report that was given to you for any specific questions about what was found during the examination.  If the procedure report does not answer your questions, please call your gastroenterologist to clarify.  If you requested that your care partner not be given the details of your procedure findings, then the procedure report has been included in a sealed envelope for you to review at your convenience later.  YOU SHOULD EXPECT: Some feelings of bloating in the abdomen. Passage of more gas than usual.  Walking can help get rid of the air that was put into your GI tract during the procedure and reduce the bloating. If you had a lower endoscopy (such as a colonoscopy or flexible sigmoidoscopy) you may notice spotting of blood in your stool or on the toilet paper. If you underwent a bowel prep for your procedure, you may not have a normal bowel movement for a few days.  Please Note:  You might notice some irritation and congestion in your nose or some drainage.  This is from the oxygen used during your procedure.  There is no need for concern and it should clear up in a day or so.  SYMPTOMS TO REPORT IMMEDIATELY:  Following lower endoscopy (colonoscopy or flexible sigmoidoscopy):  Excessive amounts of blood in the stool  Significant tenderness or worsening of abdominal pains  Swelling of the abdomen that is new, acute  Fever of 100F or higher    For urgent or emergent issues, a gastroenterologist can be reached at any hour by calling [phone]. Do not use MyChart messaging for urgent concerns.    DIET:  We do recommend a small meal at first, but then you may proceed to your regular diet.  Drink plenty of fluids but you should avoid alcoholic beverages for 24 hours. Try to eat a high fiber diet, and try to drink plenty of  water.  ACTIVITY:  You should plan to take it easy for the rest of today and you should NOT DRIVE or use heavy machinery until tomorrow (because of the sedation medicines used during the test).    FOLLOW UP: Our staff will call the number listed on your records 48-72 hours following your procedure to check on you and address any questions or concerns that you may have regarding the information given to you following your procedure. If we do not reach you, we will leave a message.  We will attempt to reach you two times.  During this call, we will ask if you have developed any symptoms of COVID 19. If you develop any symptoms (ie: fever, flu-like symptoms, shortness of breath, cough etc.) before then, please call [phone].  If you test positive for Covid 19 in the 2 weeks post procedure, please call and report this information to [place].    If any biopsies were taken you will be contacted by phone or by letter within the next 1-3 weeks.  Please call us at [phone] if you have not heard about the biopsies in 3 weeks.    SIGNATURES/CONFIDENTIALITY: You and/or your care partner have signed paperwork which will be entered into your electronic medical record.  These signatures attest to the fact that that the information above on your After Visit Summary has been reviewed and is understood.  Full responsibility of the confidentiality  of this discharge information lies with you and/or your care-partner.  

## 2021-02-03 NOTE — Progress Notes (Signed)
PT taken to PACU. Monitors in place. VSS. Report given to RN. 

## 2021-02-03 NOTE — Progress Notes (Signed)
Pt's states no medical or surgical changes since previsit or office visit. VS assessed by AS 

## 2021-02-08 DIAGNOSIS — F4320 Adjustment disorder, unspecified: Secondary | ICD-10-CM | POA: Diagnosis not present

## 2021-02-09 ENCOUNTER — Other Ambulatory Visit: Payer: Self-pay

## 2021-02-09 DIAGNOSIS — R197 Diarrhea, unspecified: Secondary | ICD-10-CM

## 2021-02-09 MED ORDER — COLESTIPOL HCL 1 G PO TABS: 1.0000 g | ORAL_TABLET | Freq: Every day | ORAL | 3 refills | Status: DC

## 2021-02-12 DIAGNOSIS — E039 Hypothyroidism, unspecified: Secondary | ICD-10-CM | POA: Diagnosis not present

## 2021-02-12 DIAGNOSIS — R197 Diarrhea, unspecified: Secondary | ICD-10-CM | POA: Insufficient documentation

## 2021-02-12 DIAGNOSIS — N951 Menopausal and female climacteric states: Secondary | ICD-10-CM | POA: Insufficient documentation

## 2021-02-16 ENCOUNTER — Other Ambulatory Visit: Payer: Federal, State, Local not specified - PPO

## 2021-02-16 DIAGNOSIS — R232 Flushing: Secondary | ICD-10-CM | POA: Diagnosis not present

## 2021-02-16 DIAGNOSIS — R6882 Decreased libido: Secondary | ICD-10-CM | POA: Diagnosis not present

## 2021-02-16 DIAGNOSIS — N951 Menopausal and female climacteric states: Secondary | ICD-10-CM | POA: Diagnosis not present

## 2021-02-16 DIAGNOSIS — R5383 Other fatigue: Secondary | ICD-10-CM | POA: Diagnosis not present

## 2021-02-16 NOTE — Telephone Encounter (Signed)
Spoke with patient to schedule an appointment, she is feeling a little better today. I let her know that we can not do IV fluids in the office, that if she feels like she is dehydrated that she would need to go to hospital for stat labs and IV fluids. I let her know we could see her at 12:30 today, she said she had to go to work, she could come Thursday or Friday. I also ask if she had followed up with GI doctor for the diarrhea and she has not, so she was going to call them. I did let her know she needs to drink something besides water if she thinks she is getting dehydrated, because water has no nutrients in it for her. She stated she would call back.

## 2021-02-19 ENCOUNTER — Encounter: Payer: Self-pay | Admitting: Gastroenterology

## 2021-02-19 ENCOUNTER — Ambulatory Visit: Payer: Federal, State, Local not specified - PPO | Admitting: Gastroenterology

## 2021-02-19 VITALS — BP 100/72 | HR 73 | Ht 68.0 in | Wt 128.2 lb

## 2021-02-19 DIAGNOSIS — R194 Change in bowel habit: Secondary | ICD-10-CM | POA: Diagnosis not present

## 2021-02-19 NOTE — Progress Notes (Signed)
Referring Provider: Elby Showers, MD Primary Care Physician:  Elby Showers, MD   Chief complaint:  Altered bowel habits following colonoscopy   IMPRESSION:  Constipation occurring after colonoscopy Chronic diarrhea with lack of formed BM for >20 years    - Celiac testing and TSH normal    - random colon biopsies normal 10/22 History of IBS  IBS may be exacerbated by colonoscopy. There may also be a dramatic disruption of colonic bacteria. Thankfully, this is usually short term but it can take weeks to months for the gut to recover.   PLAN: - Discussed probiotic use - samples of Restora provided and probiotic rich diet - Drink at least 64 ounces of water, exercise regularly - Trial of enema or Linzess (72 mcg samples provided) - Send a MyChart message on Monday with a symptom update   HPI: Jennifer Scott is a 45 y.o. female initially seen as a self-referral for chronic diarrhea 01/28/21. She had endoscopic evaluation 02/03/21. Dr. Renold Genta referred her for colon cancer screening. At the time of scheduling, she reported chronic diarrhea to our staff, who recommended that she had an office visit prior to scheduling her colonoscopy.  She reported intermittent diarrhea described as a poorly formed stool every day with associated flatus for at least 10-20 years. Never has a formed bowel movement. She is able to live with the symptoms but finds them bothersome. No associated abdominal pain or cramping. No blood or mucous in the stool. No constipation. Drinks several sodas a day. Rare sugar free sodas. No foods that trigger diarrhea.   Seen by a GI in [place] several years ago (can't remember the name) who screened for celiac disease by labs. Testing was negative.  Dietary recommendations were made but she didn't follow-through.   Diagnosed with IBS by her PCP at [facility].   Colonoscopy 02/03/2021 showed a large amount of stool throughout the colon, a small cecal tubular adenoma,  internal hemorrhoids.  Random colon biopsies were normal.  Colonoscopy originally recommended in 7 years.   Testing for Giardia recommended.  Stool sample not submitted because she has been experiencing constipation. Trial of colestipol 1 g every morning recommended but not started due to constipation.   Following her colonoscopy she has felt constipated and bloated. She even went 7 days between bowel movements. She denies any history of constipation in the past. She has had none of the diarrhea symptoms that preceded her colonoscopy.  No new medications, diet changes, or changes in medical history.  "I want my diarrhea back."  Labs 12/03/20: normal CMP and CBC. Normal TSH. Labs 01/18/21: normal TSH Past Medical History:  Diagnosis Date   Anxiety    Medical history non-contributory    Migraines    Substance abuse ([place])     Past Surgical History:  Procedure Laterality Date   CERVICAL POLYPECTOMY N/A 10/10/2012   Procedure: CERVICAL POLYPECTOMY;  Surgeon: Linda Hedges, DO;  Location: [facility];  Service: Gynecology;  Laterality: N/A;   HYSTEROSCOPY WITH D & C N/A 10/10/2012   Procedure: DILATATION AND CURETTAGE /HYSTEROSCOPY;  Surgeon: Linda Hedges, DO;  Location: [facility];  Service: Gynecology;  Laterality: N/A;   WISDOM TOOTH EXTRACTION       Current Outpatient Medications  Medication Sig Dispense Refill   colestipol (COLESTID) 1 g tablet Take 1 tablet (1 g total) by mouth daily. 30 tablet 3   levothyroxine (SYNTHROID) 50 MCG tablet Take 1 tablet (50 mcg total) by mouth daily. 90 tablet  0   liothyronine (CYTOMEL) 5 MCG tablet Take by mouth.     propranolol (INDERAL) 10 MG tablet Take 10 mg by mouth 2 (two) times daily.     rizatriptan (MAXALT) 10 MG tablet Take 1 tablet (10 mg total) by mouth as needed for migraine. May repeat in 2 hours if needed 10 tablet 2   traZODone (DESYREL) 50 MG tablet Take 50-100 mg by mouth at bedtime.     venlafaxine XR (EFFEXOR-XR) 75 MG 24 hr capsule Take 150  mg by mouth daily.     No current facility-administered medications for this visit.    Allergies as of 02/19/2021   (No Known Allergies)    Family History  Problem Relation Age of Onset   Migraines Mother    Heart disease Father    Alzheimer's disease Father    Hypertension Father    Diabetes Father    Thyroid disease Father    Hypertension Brother    Cancer Paternal Grandmother    Cancer Paternal Grandfather    Colon cancer Neg Hx    Colon polyps Neg Hx     Physical Exam: General:   Alert,  well-nourished, pleasant and cooperative in NAD Head:  Normocephalic and atraumatic. Eyes:  Sclera clear, no icterus.   Conjunctiva pink. Abdomen:  Soft, thin, nontender, nondistended, normal bowel sounds, no rebound or guarding. No hepatosplenomegaly.   Neurologic:  Alert and  oriented x4;  grossly nonfocal Skin:  Intact without significant lesions or rashes. Psych:  Alert and cooperative. Normal mood and affect.    Donne Baley L. Tarri Glenn, MD, MPH 02/19/2021, 10:52 AM

## 2021-02-19 NOTE — Patient Instructions (Addendum)
It was my pleasure to provide care to you today. Based on our discussion, I am providing you with my recommendations below:  RECOMMENDATION(S):   Avoid milk products, raw fruits, raw vegetables, high fat foods, artifical sweeteners, and carbonated beverages I have provided you with Restora and Linzess Please send me a My Chart message on Monday with an update about your symptoms  LABS:   It does not appear you have submitted your stool specimen for processing. Should you symptoms recur, please proceed to the lab to complete NOTE: Press "B" on the elevator. The lab is located at the first door on the left as you exit the elevator.  HEALTHCARE LAWS AND MY CHART RESULTS:   Due to recent changes in healthcare laws, you may see results of your imaging and/or laboratory studies on MyChart before I have had a chance to review them.  I understand that in some cases there may be results that are confusing or concerning to you. Please understand that not all results are received at the same time and often I may need to interpret multiple results in order to provide you with the best plan of care or course of treatment. Therefore, I ask that you please give me 48 hours to thoroughly review all your results before contacting my office for clarification.   FOLLOW UP:  I would like for you to follow up with me as needed. Please call the office at [phone] to schedule your appointment.  BMI:  If you are age 83 or younger, your body mass index should be between 19-25. Your Body mass index is 19.5 kg/m. If this is out of the aformentioned range listed, please consider follow up with your Primary Care Provider.   MY CHART:  The [facility] providers would like to encourage you to use [facility] to communicate with providers for non-urgent requests or questions.  Due to long hold times on the telephone, sending your provider a message by [facility] may be a faster and more efficient way to get a response.   Please allow 48 business hours for a response.  Please remember that this is for non-urgent requests.   Thank you for trusting me with your gastrointestinal care!    Thornton Park, MD, MPH

## 2021-02-22 ENCOUNTER — Telehealth: Payer: Self-pay

## 2021-02-22 ENCOUNTER — Other Ambulatory Visit: Payer: Self-pay

## 2021-02-22 DIAGNOSIS — F4320 Adjustment disorder, unspecified: Secondary | ICD-10-CM | POA: Diagnosis not present

## 2021-02-22 DIAGNOSIS — R1084 Generalized abdominal pain: Secondary | ICD-10-CM

## 2021-02-22 DIAGNOSIS — K5900 Constipation, unspecified: Secondary | ICD-10-CM

## 2021-02-22 DIAGNOSIS — R140 Abdominal distension (gaseous): Secondary | ICD-10-CM

## 2021-02-22 DIAGNOSIS — R194 Change in bowel habit: Secondary | ICD-10-CM

## 2021-02-22 MED ORDER — LINACLOTIDE 145 MCG PO CAPS: 145.0000 ug | ORAL_CAPSULE | Freq: Every day | ORAL | 2 refills | Status: DC

## 2021-02-22 NOTE — Telephone Encounter (Signed)
PRIOR AUTHORIZATION  PA initiation date: 02/22/21  Medication: Linzess 145 mcg Insurance Company: [employer] completed electronically through [employer] My Meds: Yes  Will await insurance response re: approval/denial.  Quentin Angst (Key: [id number])  Your information has been submitted to [place]. To check for an updated outcome later, reopen this PA request from your dashboard.  If [employer] has not responded to your request within 24 hours, contact [place] at [phone]. If you think there may be a problem with your PA request, use our live chat feature at the bottom right.

## 2021-02-23 NOTE — Telephone Encounter (Signed)
APPROVAL  Medication: Linzess 173mcg Insurance Company: [employer] PA response: Approved Approval dates: 01/23/21 through 02/22/22 Misc. Notes: Jennifer Scott (Key: [id number])  This request has received an approval. View the bottom of the request for an electronic copy of the approval letter. [facility] Key: [id number] - PA Case ID: [id number] Need help? Call us at [phone] Outcome Approvedtoday Your PA request has been approved. Additional information will be provided in the approval communication. (Message 1145) Drug Linzess 145MCG capsules Form [employer] Electronic PA Form [phone] NCPDP)

## 2021-03-05 ENCOUNTER — Ambulatory Visit: Payer: Federal, State, Local not specified - PPO | Admitting: Gastroenterology

## 2021-03-07 ENCOUNTER — Other Ambulatory Visit: Payer: Self-pay | Admitting: Internal Medicine

## 2021-03-09 DIAGNOSIS — F4320 Adjustment disorder, unspecified: Secondary | ICD-10-CM | POA: Diagnosis not present

## 2021-03-16 ENCOUNTER — Other Ambulatory Visit: Payer: Federal, State, Local not specified - PPO | Admitting: Internal Medicine

## 2021-03-16 ENCOUNTER — Other Ambulatory Visit: Payer: Self-pay

## 2021-03-16 DIAGNOSIS — E039 Hypothyroidism, unspecified: Secondary | ICD-10-CM | POA: Diagnosis not present

## 2021-03-16 LAB — TSH: TSH: 0.29 mIU/L — ABNORMAL LOW

## 2021-03-17 ENCOUNTER — Ambulatory Visit: Payer: Federal, State, Local not specified - PPO | Admitting: Gastroenterology

## 2021-03-18 ENCOUNTER — Encounter: Payer: Self-pay | Admitting: Internal Medicine

## 2021-03-18 ENCOUNTER — Other Ambulatory Visit: Payer: Self-pay

## 2021-03-18 ENCOUNTER — Ambulatory Visit: Payer: Federal, State, Local not specified - PPO | Admitting: Internal Medicine

## 2021-03-18 DIAGNOSIS — E039 Hypothyroidism, unspecified: Secondary | ICD-10-CM | POA: Diagnosis not present

## 2021-03-18 NOTE — Patient Instructions (Signed)
Continue same dose of thyroid replacement and we will recheck in late January. Glad you are feeling better. It was a pleasure to see you today.

## 2021-03-18 NOTE — Progress Notes (Signed)
   Subjective:    Patient ID: Jennifer Scott, female    DOB: 03/10/1976, 45 y.o.   MRN: [id number]  HPI  Pleasant 45 year old Female seen for follow up on hypothyroidism.  First presented to the office in August 2022 as a new patient.  History of hypothyroidism and has been taking [place] Thyroid.  In August we started her on levothyroxine 50 mcg daily.  She followed up here in October and TSH was 0.41.  FSH was consistent with menopause.  She has a history of migraine headaches.  She has appointment with gastroenterologist regarding diarrhea.  He is now on Align.  Diarrhea has improved but likely has irritable bowel syndrome.  Repeat colonoscopy recommended in 7 years.  Had a small tubular adenoma on colonoscopy 02/03/2021.  She is also now going to [facility] for hormonal evaluation and now is on Cytomel as well.  Repeat TSH on November 29 was low at 0.29 and has been 0.41 in October.    Review of Systems irritable bowel symptoms improved with Align     Objective:   Physical Exam She looks well.  Weight is 132.5 pounds.  Has been 128 pounds 5 ounces when seen here in August for the first time.  No thyromegaly     Assessment & Plan:  Discussion about thyroid replacement medication.  She currently feels well.  I have not prescribed Cytomel during my years in practice.  She feels that it is helping her.  We have agreed she will continue this for now.  Her TSH is low at 0.29 with normal being between 0.40 and 4.5.  Ideally I would like her TSH to be around 1.  3 months ago it was 0.54.  We are going to see what TSH is in about 4 weeks or so and determine if we need to decrease dose of thyroid replacement at that time.  She is agreeable with this plan.

## 2021-03-24 DIAGNOSIS — F4320 Adjustment disorder, unspecified: Secondary | ICD-10-CM | POA: Diagnosis not present

## 2021-03-29 DIAGNOSIS — E039 Hypothyroidism, unspecified: Secondary | ICD-10-CM | POA: Diagnosis not present

## 2021-03-29 DIAGNOSIS — N951 Menopausal and female climacteric states: Secondary | ICD-10-CM | POA: Diagnosis not present

## 2021-03-31 DIAGNOSIS — N951 Menopausal and female climacteric states: Secondary | ICD-10-CM | POA: Diagnosis not present

## 2021-03-31 DIAGNOSIS — Z681 Body mass index (BMI) 19 or less, adult: Secondary | ICD-10-CM | POA: Diagnosis not present

## 2021-03-31 DIAGNOSIS — R232 Flushing: Secondary | ICD-10-CM | POA: Diagnosis not present

## 2021-03-31 DIAGNOSIS — R6882 Decreased libido: Secondary | ICD-10-CM | POA: Diagnosis not present

## 2021-04-09 DIAGNOSIS — F4320 Adjustment disorder, unspecified: Secondary | ICD-10-CM | POA: Diagnosis not present

## 2021-04-18 HISTORY — PX: COLONOSCOPY: SHX174

## 2021-04-18 HISTORY — PX: MRI: SHX5353

## 2021-05-10 DIAGNOSIS — F4320 Adjustment disorder, unspecified: Secondary | ICD-10-CM | POA: Diagnosis not present

## 2021-05-12 DIAGNOSIS — H9313 Tinnitus, bilateral: Secondary | ICD-10-CM | POA: Diagnosis not present

## 2021-05-13 IMAGING — MG MM BREAST LOCALIZATION CLIP
4 series · 4 of 12 positions shown · non-contrast
Comparison: Previous exam(s).

CLINICAL DATA: Patient status post stereotactic guided core needle
biopsy right breast calcifications.

EXAM:
DIAGNOSTIC RIGHT MAMMOGRAM POST STEREOTACTIC BIOPSY

[R ML synth-2D]
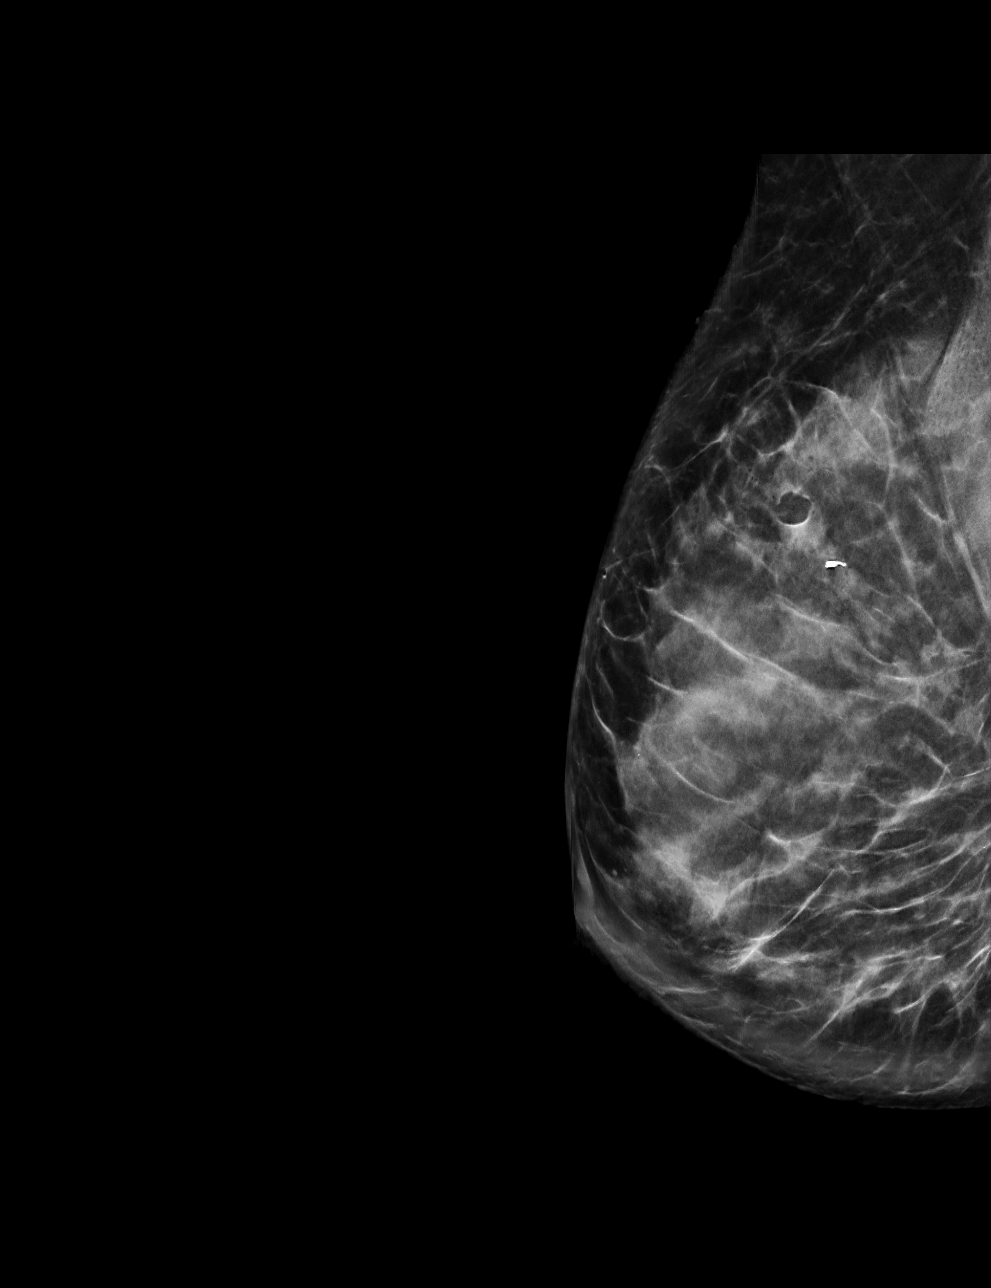

[R CC synth-2D]
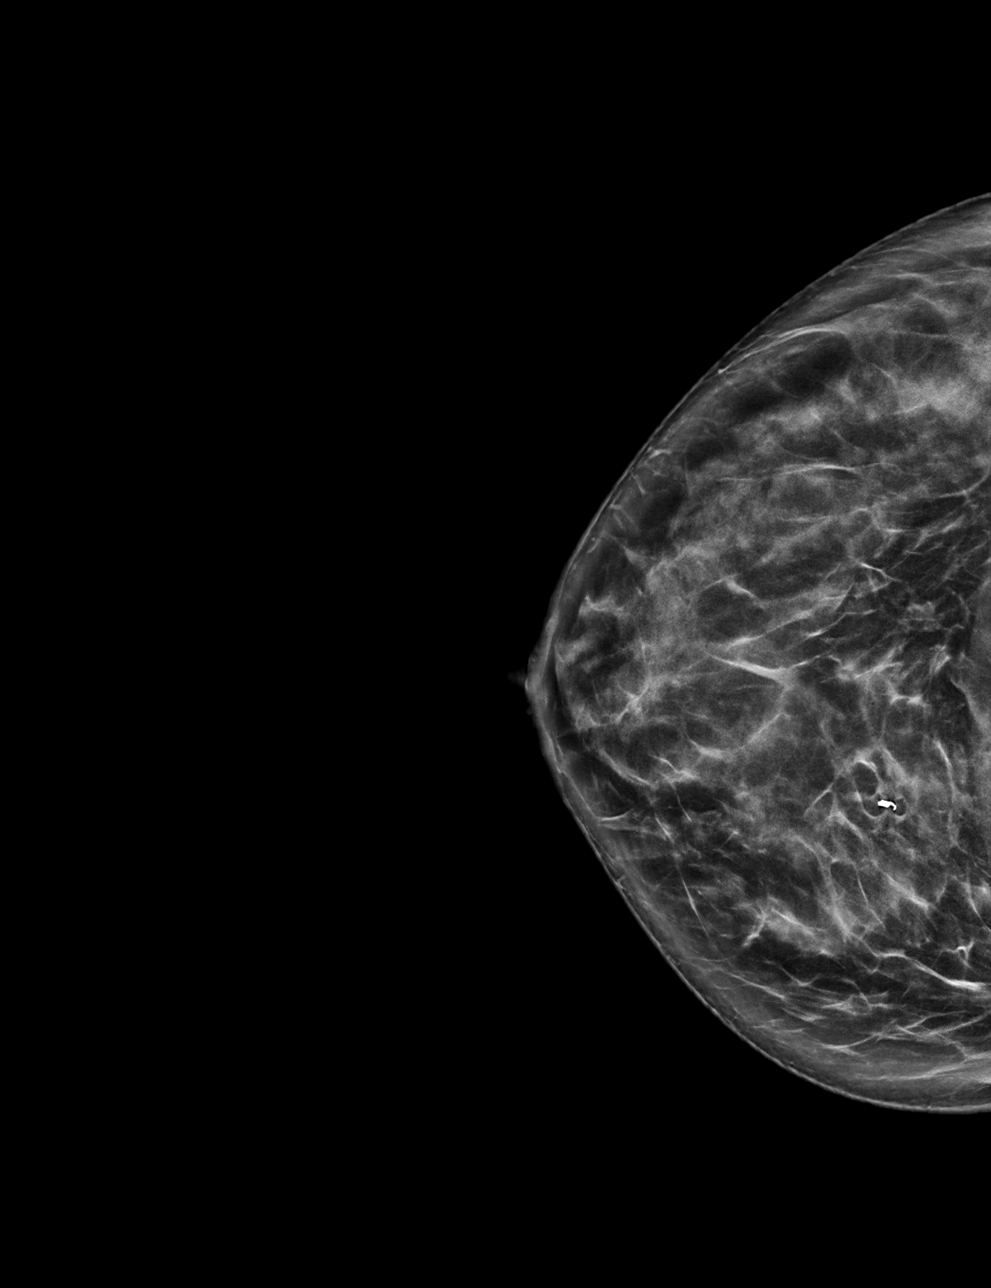

[R CC tomo · tomo slice 32/63.0]
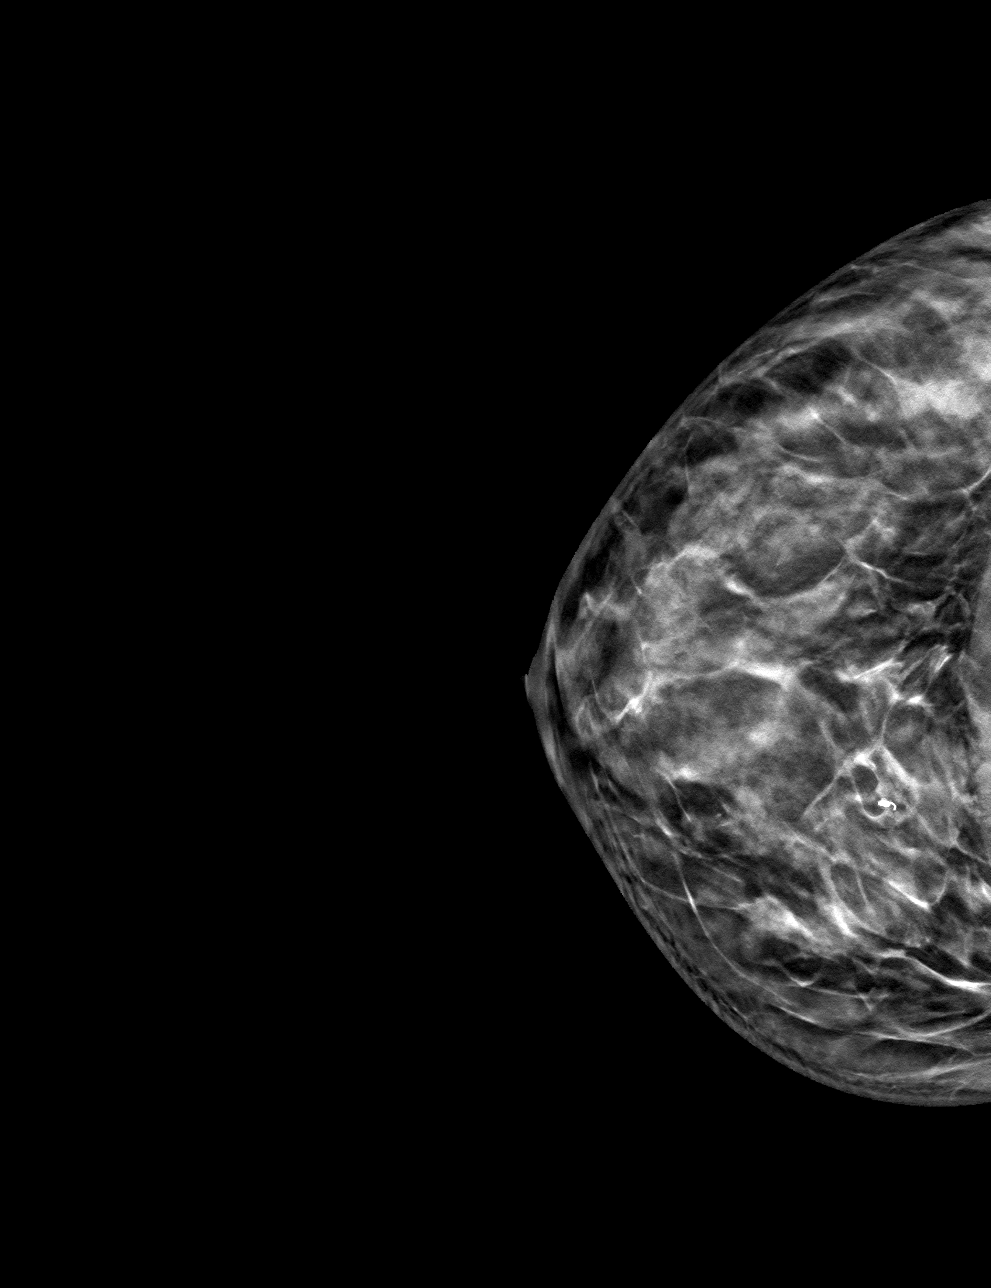

[R ML tomo · tomo slice 35/69.0]
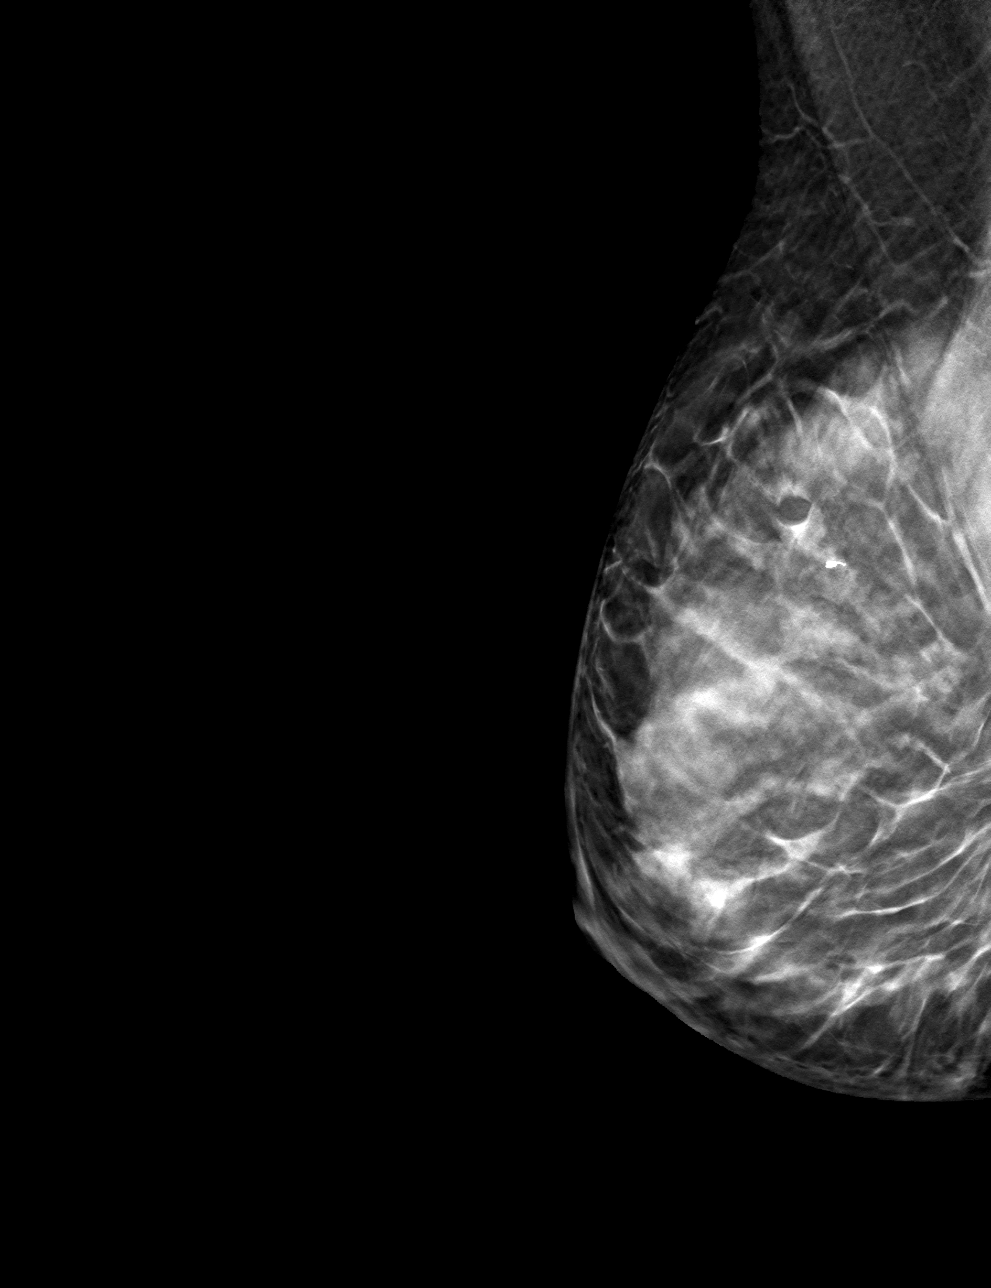

[4 of 12 positions shown; findings below may reference images not displayed]

FINDINGS: Mammographic images were obtained following stereotactic guided
biopsy of right breast calcifications. The biopsy marking clip is in
expected position at the site of biopsy.
IMPRESSION: Appropriate positioning of the coil shaped biopsy marking clip at
the site of biopsy in the upper inner right breast.

Final Assessment: Post Procedure Mammograms for Marker Placement

## 2021-05-21 DIAGNOSIS — H9313 Tinnitus, bilateral: Secondary | ICD-10-CM | POA: Diagnosis not present

## 2021-05-27 DIAGNOSIS — F4320 Adjustment disorder, unspecified: Secondary | ICD-10-CM | POA: Diagnosis not present

## 2021-06-02 DIAGNOSIS — E039 Hypothyroidism, unspecified: Secondary | ICD-10-CM | POA: Diagnosis not present

## 2021-06-02 DIAGNOSIS — N951 Menopausal and female climacteric states: Secondary | ICD-10-CM | POA: Diagnosis not present

## 2021-06-08 DIAGNOSIS — R5383 Other fatigue: Secondary | ICD-10-CM | POA: Diagnosis not present

## 2021-06-08 DIAGNOSIS — R6882 Decreased libido: Secondary | ICD-10-CM | POA: Diagnosis not present

## 2021-06-08 DIAGNOSIS — E039 Hypothyroidism, unspecified: Secondary | ICD-10-CM | POA: Diagnosis not present

## 2021-06-08 DIAGNOSIS — N951 Menopausal and female climacteric states: Secondary | ICD-10-CM | POA: Diagnosis not present

## 2021-06-14 DIAGNOSIS — F4320 Adjustment disorder, unspecified: Secondary | ICD-10-CM | POA: Diagnosis not present

## 2021-06-28 DIAGNOSIS — F4320 Adjustment disorder, unspecified: Secondary | ICD-10-CM | POA: Diagnosis not present

## 2021-07-13 DIAGNOSIS — F4320 Adjustment disorder, unspecified: Secondary | ICD-10-CM | POA: Diagnosis not present

## 2021-07-20 ENCOUNTER — Other Ambulatory Visit: Payer: Self-pay | Admitting: Internal Medicine

## 2021-07-20 NOTE — Telephone Encounter (Signed)
CPE has been scheduled for 01/31/2022 first available. ?

## 2021-07-28 DIAGNOSIS — F4320 Adjustment disorder, unspecified: Secondary | ICD-10-CM | POA: Diagnosis not present

## 2021-08-18 ENCOUNTER — Telehealth: Payer: Self-pay | Admitting: Internal Medicine

## 2021-08-18 NOTE — Telephone Encounter (Signed)
Dr Lulu Riding called to say he was seeing Jennifer Scott for her anxiety. He stated she has episodes of binge eating and he has found that low doses of Naltrexone helps and he was wandering if you would consider seeing her and prescribing this for her. He stated that you could call him anytime [phone] is his cell. I ask for office notes and his does not have a way to fax them, he stated that the patient has them in a PDF file by the way the company does them that he works for. They do telehealth. ?

## 2021-08-19 NOTE — Telephone Encounter (Signed)
Called Dr Daron Offer to let him know that Dr Renold Genta was unwilling to prescribe the medication, she had no experience with it. ?

## 2021-08-20 DIAGNOSIS — F4320 Adjustment disorder, unspecified: Secondary | ICD-10-CM | POA: Diagnosis not present

## 2021-08-23 DIAGNOSIS — Z79890 Hormone replacement therapy (postmenopausal): Secondary | ICD-10-CM | POA: Diagnosis not present

## 2021-08-30 ENCOUNTER — Encounter: Payer: Self-pay | Admitting: Nurse Practitioner

## 2021-08-30 ENCOUNTER — Ambulatory Visit: Payer: Federal, State, Local not specified - PPO | Admitting: Nurse Practitioner

## 2021-08-30 VITALS — BP 104/58 | HR 77 | Temp 97.1°F | Resp 20 | Ht 68.0 in | Wt 140.0 lb

## 2021-08-30 DIAGNOSIS — R632 Polyphagia: Secondary | ICD-10-CM

## 2021-08-30 DIAGNOSIS — N951 Menopausal and female climacteric states: Secondary | ICD-10-CM

## 2021-08-30 DIAGNOSIS — F419 Anxiety disorder, unspecified: Secondary | ICD-10-CM | POA: Diagnosis not present

## 2021-08-30 DIAGNOSIS — G43909 Migraine, unspecified, not intractable, without status migrainosus: Secondary | ICD-10-CM

## 2021-08-30 DIAGNOSIS — E039 Hypothyroidism, unspecified: Secondary | ICD-10-CM | POA: Diagnosis not present

## 2021-08-30 DIAGNOSIS — G4700 Insomnia, unspecified: Secondary | ICD-10-CM

## 2021-08-30 DIAGNOSIS — F1021 Alcohol dependence, in remission: Secondary | ICD-10-CM

## 2021-08-30 MED ORDER — RIZATRIPTAN BENZOATE 10 MG PO TABS: ORAL_TABLET | ORAL | 2 refills | Status: DC

## 2021-08-30 MED ORDER — LEVOTHYROXINE SODIUM 50 MCG PO TABS: 50.0000 ug | ORAL_TABLET | Freq: Every day | ORAL | 3 refills | Status: DC

## 2021-08-30 NOTE — Patient Instructions (Addendum)
To sign record release for Psychiatry and [place] office.  ? ?To sign record release for physician for women.  ?

## 2021-08-30 NOTE — Progress Notes (Signed)
? ? ?Careteam: ?Patient Care Team: ?Lauree Chandler, NP as PCP - General (Geriatric Medicine) ?Thornton Park, MD as Consulting Physician (Gastroenterology) ?Starlyn Skeans as Librarian, academic (Dermatology) ?Linda Hedges, DO as Consulting Physician (Obstetrics and Gynecology) ? ?PLACE OF SERVICE:  ?[facility]  ?Advanced Directive information ?Does Patient Have a Medical Advance Directive?: No, Would patient like information on creating a medical advance directive?: No - Patient declined ? ?No Known Allergies ? ?Chief Complaint  ?Patient presents with  ? Establish Care  ?  Patient is new and here to establish care. Patient doeshave pill bottles with her with the exception of B12 , Cytomel, Needs levothyroxine refill.  ? ? ? ?HPI: Patient is a 46 y.o. female to establish care.  ? ?She sees a psychiatrist recommended to her to start naltrexone due to her binge eating and to help with alcohol cravings. Has been through treatment plan with alcohol addiction in 2020.  ?Has been sober for 2 years. Also has a counsel and psychiatrist. She is in [place] doing the full treatment.  ? ?Reports no she is in menopause and having cravings for food. Menopausal symptoms, hot flashes, taking estradiol patch twice weekly, has helped but still there.  ? ?For supplement using B12 ? ?Hypothyroid- acquired- taking cytomel and synthroid daily. TSH was low in November. Following by hormone therapy clinic.  ? ?uses goody powder weekly for migraines. Also uses  maxalt PRN- does not have any at this time.  ?Worse when she does not sleep. Also having trouble sleeping.  ? ?Anxiety- controlled on Effexor  ? ?Review of Systems:  ?Review of Systems  ?Constitutional:  Negative for chills, fever and weight loss.  ?HENT:  Negative for tinnitus.   ?Respiratory:  Negative for cough, sputum production and shortness of breath.   ?Cardiovascular:  Negative for chest pain, palpitations and leg swelling.  ?Gastrointestinal:  Negative for  abdominal pain, constipation, diarrhea and heartburn.  ?Genitourinary:  Negative for dysuria, frequency and urgency.  ?Musculoskeletal:  Negative for back pain, falls, joint pain and myalgias.  ?Skin: Negative.   ?Neurological:  Negative for dizziness and headaches.  ?Psychiatric/Behavioral:  Negative for depression and memory loss. The patient does not have insomnia.   ? ?Past Medical History:  ?Diagnosis Date  ? Anxiety   ? Hypothyroidism   ? Per new patient packet  ? Medical history non-contributory   ? Migraines   ? Substance abuse ([place])   ? ?Past Surgical History:  ?Procedure Laterality Date  ? CERVICAL POLYPECTOMY N/A 10/10/2012  ? Procedure: CERVICAL POLYPECTOMY;  Surgeon: Linda Hedges, DO;  Location: [facility];  Service: Gynecology;  Laterality: N/A;  ? COLONOSCOPY  2023  ? Per new patient packet  ? HYSTEROSCOPY WITH D & C N/A 10/10/2012  ? Procedure: DILATATION AND CURETTAGE /HYSTEROSCOPY;  Surgeon: Linda Hedges, DO;  Location: [facility];  Service: Gynecology;  Laterality: N/A;  ? MRI  2023  ? Tinnitus, [facility]) / Per new patient packet  ? WISDOM TOOTH EXTRACTION    ? ?Social History: ?  reports that she has never smoked. She has never used smokeless tobacco. She reports that she does not currently use alcohol. She reports that she does not use drugs. ? ?Family History  ?Problem Relation Age of Onset  ? Migraines Mother   ? Hypothyroidism Mother   ? Heart disease Father   ? Alzheimer's disease Father   ? Hypertension Father   ? Diabetes Father   ? Thyroid disease  Father   ? Hypertension Brother   ? Cancer Paternal Grandmother   ? Cancer Paternal Grandfather   ? Colon cancer Neg Hx   ? Colon polyps Neg Hx   ? ? ?Medications: ?Patient's Medications  ?New Prescriptions  ? No medications on file  ?Previous Medications  ? CYANOCOBALAMIN (B-12 PO)    Take by mouth as needed.  ? ESTRADIOL (VIVELLE-DOT) 0.05 MG/24HR PATCH    Place 1 patch onto the skin 2 (two) times a week.  ? LEVOTHYROXINE (SYNTHROID)  50 MCG TABLET    TAKE 1 TABLET BY MOUTH EVERY DAY  ? LIOTHYRONINE (CYTOMEL) 5 MCG TABLET    Take 5 mcg by mouth as needed.  ? MELATONIN PO    Take by mouth as needed.  ? MIRTAZAPINE (REMERON PO)    Take by mouth as needed.  ? MULTIPLE VITAMIN (MULTI-DAY VITAMINS PO)    Take by mouth daily.  ? PROGESTERONE PO    Take by mouth daily.  ? UNABLE TO FIND    as needed. Med Name: Gabriel Earing powder  ? VENLAFAXINE XR (EFFEXOR-XR) 75 MG 24 HR CAPSULE    Take 150 mg by mouth daily.  ?Modified Medications  ? No medications on file  ?Discontinued Medications  ? No medications on file  ? ? ?Physical Exam: ? ?Vitals:  ? 08/30/21 1328  ?BP: (!) 104/58  ?Pulse: 77  ?Resp: 20  ?Temp: (!) 97.1 ?F (36.2 ?C)  ?SpO2: 98%  ?Weight: 140 lb (63.5 kg)  ?Height: '5\' 8"'$  (1.727 m)  ? ?Body mass index is 21.29 kg/m?. ?Wt Readings from Last 3 Encounters:  ?08/30/21 140 lb (63.5 kg)  ?02/19/21 128 lb 4 oz (58.2 kg)  ?02/03/21 127 lb (57.6 kg)  ? ? ?Physical Exam ?Constitutional:   ?   General: She is not in acute distress. ?   Appearance: She is well-developed. She is not diaphoretic.  ?HENT:  ?   Head: Normocephalic and atraumatic.  ?   Mouth/Throat:  ?   Pharynx: No oropharyngeal exudate.  ?Eyes:  ?   Conjunctiva/sclera: Conjunctivae normal.  ?   Pupils: Pupils are equal, round, and reactive to light.  ?Cardiovascular:  ?   Rate and Rhythm: Normal rate and regular rhythm.  ?   Heart sounds: Normal heart sounds.  ?Pulmonary:  ?   Effort: Pulmonary effort is normal.  ?   Breath sounds: Normal breath sounds.  ?Abdominal:  ?   General: Bowel sounds are normal.  ?   Palpations: Abdomen is soft.  ?Musculoskeletal:  ?   Cervical back: Normal range of motion and neck supple.  ?   Right lower leg: No edema.  ?   Left lower leg: No edema.  ?Skin: ?   General: Skin is warm and dry.  ?Neurological:  ?   Mental Status: She is alert.  ?Psychiatric:     ?   Mood and Affect: Mood normal.  ? ? ?Labs reviewed: ?Basic Metabolic Panel: ?Recent Labs  ?  12/03/20 ?0929  01/18/21 ?1209 03/16/21 ?0958  ?NA 145  --   --   ?K 4.4  --   --   ?CL 106  --   --   ?CO2 30  --   --   ?GLUCOSE 86  --   --   ?BUN 15  --   --   ?CREATININE 0.81  --   --   ?CALCIUM 9.9  --   --   ?TSH 0.54 0.41 0.29*  ? ?  Liver Function Tests: ?Recent Labs  ?  12/03/20 ?0929  ?AST 18  ?ALT 19  ?BILITOT 0.6  ?PROT 6.8  ? ?No results for input(s): LIPASE, AMYLASE in the last 8760 hours. ?No results for input(s): AMMONIA in the last 8760 hours. ?CBC: ?Recent Labs  ?  12/03/20 ?0929  ?WBC 5.0  ?NEUTROABS 1,970  ?HGB 13.3  ?HCT 39.5  ?MCV 87.0  ?PLT 249  ? ?Lipid Panel: ?Recent Labs  ?  12/03/20 ?0929  ?CHOL 173  ?HDL 68  ?[place] 93  ?TRIG 41  ?CHOLHDL 2.5  ? ?TSH: ?Recent Labs  ?  12/03/20 ?0929 01/18/21 ?1209 03/16/21 ?0958  ?TSH 0.54 0.41 0.29*  ? ?A1C: ?No results found for: HGBA1C ? ? ?Assessment/Plan ?1. Migraine without status migrainosus, not intractable, unspecified migraine type ?-stable, using maxalt PRN ?- rizatriptan (MAXALT) 10 MG tablet; TAKE 1 TABLET BY MOUTH AS NEEDED FOR MIGRAINE. MAY REPEAT IN 2 HOURS IF NEEDED  Dispense: 10 tablet; Refill: 2 ? ?2. Acquired hypothyroidism ?-TSH followed by hormone therapy clinic, will get records ?- levothyroxine (SYNTHROID) 50 MCG tablet; Take 1 tablet (50 mcg total) by mouth daily.  Dispense: 30 tablet; Refill: 3 ? ?3. Anxiety ?-followed by psychiatrist and therapist, continues on effexor ? ?4. Menopausal state ?-continues on estradiol and progesterone to manage symptoms ? ?5. Alcohol dependence in remission [facility]) ?-continues to go to AA, following with psych and therapist to manage cravings and support.  ? ?6. Binge eating ?Continues to follow up with psychiatrist and therapist.  ? ?7. Insomnia, unspecified type ?-using remeron PRN, educated this can cause increase in appetite.  ?-continues on melatonin at bedtime.  ? ? ?Return in about 3 months (around 11/30/2021) for routine follow up and fasting lab.Marland Kitchen ?Carlos American. Dewaine Oats, AGNP ? ?[facility] ?[phone]  ?

## 2021-08-31 DIAGNOSIS — F1021 Alcohol dependence, in remission: Secondary | ICD-10-CM | POA: Insufficient documentation

## 2021-09-08 DIAGNOSIS — E039 Hypothyroidism, unspecified: Secondary | ICD-10-CM | POA: Diagnosis not present

## 2021-09-08 DIAGNOSIS — N951 Menopausal and female climacteric states: Secondary | ICD-10-CM | POA: Diagnosis not present

## 2021-09-09 DIAGNOSIS — F4320 Adjustment disorder, unspecified: Secondary | ICD-10-CM | POA: Diagnosis not present

## 2021-09-10 DIAGNOSIS — N951 Menopausal and female climacteric states: Secondary | ICD-10-CM | POA: Diagnosis not present

## 2021-09-10 DIAGNOSIS — E039 Hypothyroidism, unspecified: Secondary | ICD-10-CM | POA: Diagnosis not present

## 2021-09-10 DIAGNOSIS — R6882 Decreased libido: Secondary | ICD-10-CM | POA: Diagnosis not present

## 2021-09-10 DIAGNOSIS — R232 Flushing: Secondary | ICD-10-CM | POA: Diagnosis not present

## 2021-09-15 DIAGNOSIS — F4320 Adjustment disorder, unspecified: Secondary | ICD-10-CM | POA: Diagnosis not present

## 2021-10-18 DIAGNOSIS — Z01419 Encounter for gynecological examination (general) (routine) without abnormal findings: Secondary | ICD-10-CM | POA: Diagnosis not present

## 2021-10-18 DIAGNOSIS — Z681 Body mass index (BMI) 19 or less, adult: Secondary | ICD-10-CM | POA: Diagnosis not present

## 2021-10-18 DIAGNOSIS — E039 Hypothyroidism, unspecified: Secondary | ICD-10-CM | POA: Diagnosis not present

## 2021-10-18 DIAGNOSIS — N951 Menopausal and female climacteric states: Secondary | ICD-10-CM | POA: Diagnosis not present

## 2021-10-21 DIAGNOSIS — N951 Menopausal and female climacteric states: Secondary | ICD-10-CM | POA: Diagnosis not present

## 2021-10-21 DIAGNOSIS — Z681 Body mass index (BMI) 19 or less, adult: Secondary | ICD-10-CM | POA: Diagnosis not present

## 2021-10-21 DIAGNOSIS — R232 Flushing: Secondary | ICD-10-CM | POA: Diagnosis not present

## 2021-10-21 DIAGNOSIS — R5383 Other fatigue: Secondary | ICD-10-CM | POA: Diagnosis not present

## 2021-10-21 DIAGNOSIS — F4320 Adjustment disorder, unspecified: Secondary | ICD-10-CM | POA: Diagnosis not present

## 2021-10-31 ENCOUNTER — Encounter: Payer: Self-pay | Admitting: Nurse Practitioner

## 2021-11-01 ENCOUNTER — Telehealth: Payer: Self-pay | Admitting: Internal Medicine

## 2021-11-01 ENCOUNTER — Ambulatory Visit: Payer: Federal, State, Local not specified - PPO | Admitting: Internal Medicine

## 2021-11-01 ENCOUNTER — Encounter: Payer: Self-pay | Admitting: Internal Medicine

## 2021-11-01 VITALS — BP 100/68 | HR 88 | Temp 97.9°F

## 2021-11-01 DIAGNOSIS — R519 Headache, unspecified: Secondary | ICD-10-CM

## 2021-11-01 DIAGNOSIS — J069 Acute upper respiratory infection, unspecified: Secondary | ICD-10-CM | POA: Diagnosis not present

## 2021-11-01 DIAGNOSIS — Z8669 Personal history of other diseases of the nervous system and sense organs: Secondary | ICD-10-CM | POA: Diagnosis not present

## 2021-11-01 DIAGNOSIS — H6503 Acute serous otitis media, bilateral: Secondary | ICD-10-CM | POA: Diagnosis not present

## 2021-11-01 DIAGNOSIS — E039 Hypothyroidism, unspecified: Secondary | ICD-10-CM

## 2021-11-01 DIAGNOSIS — F4320 Adjustment disorder, unspecified: Secondary | ICD-10-CM | POA: Diagnosis not present

## 2021-11-01 DIAGNOSIS — B349 Viral infection, unspecified: Secondary | ICD-10-CM

## 2021-11-01 DIAGNOSIS — R059 Cough, unspecified: Secondary | ICD-10-CM

## 2021-11-01 DIAGNOSIS — Z87891 Personal history of nicotine dependence: Secondary | ICD-10-CM

## 2021-11-01 LAB — POC COVID19 BINAXNOW: SARS Coronavirus 2 Ag: NEGATIVE

## 2021-11-01 MED ORDER — AZITHROMYCIN 250 MG PO TABS: ORAL_TABLET | ORAL | 0 refills | Status: AC

## 2021-11-01 MED ORDER — VARENICLINE TARTRATE 1 MG PO TABS: ORAL_TABLET | ORAL | 0 refills | Status: DC

## 2021-11-01 NOTE — Telephone Encounter (Signed)
scheduled

## 2021-11-01 NOTE — Progress Notes (Signed)
   Subjective:    Patient ID: Jennifer Scott, female    DOB: January 10, 1976, 46 y.o.   MRN: [id number]  HPI Onset this past Thursday July 13 of headache, sore throat, and congestion. Has fatigue. No shaking chills.  Past medical history: Hypothyroidism treated with thyroid replacement medication.  History of migraine headaches treated with Maxalt.  History of diarrhea thought to be irritable bowel syndrome treated with Align.  Had colonoscopy October 2022.  History of generalized anxiety disorder and depression treated with Effexor XR 150 mg daily history of primary insomnia.  Patient has been vaping recently.  She would like to try Chantix to stop.  She sees a Teacher, music virtually for counseling and this was recommended.  She has tried it in the past with success and had no side effects.  Rapid COVID test is negative today in the office.  Review of Systems going hiking by herself to [facility] this coming weekend     Objective:   Physical Exam Temperature 97.9 degrees pulse oximetry 97% pulse 88 blood pressure 100/68 Skin: Warm and dry.  TMs are full bilaterally and pink.  Pharynx is slightly injected.  Neck is supple.  Chest is clear to auscultation without rales or wheezing.      Assessment & Plan:  Acute upper respiratory infection  Acute bilateral serous otitis media  History of vaping-prescription for Chantix 1 mg as requested she will take one half tab daily for 3 days, then 1/2 tablet twice daily for 4 days then 1 tablet twice daily.  Follow-up here in a month.  Continue counseling.  Hypothyroidism-treated with thyroid replacement medication  History of migraine headaches  Anxiety and depression  Plan: Patient will take Zithromax Z-PAK 2 tabs day 1 followed by 1 tab days 2 through 5.  For assistance with quitting vaping, she will take Chantix 1/2 tablet daily for 3 days then 1/2 tablet twice daily for 4 days then 1 tablet twice daily to assist with vaping cessation.

## 2021-11-01 NOTE — Telephone Encounter (Signed)
Patient called and said that she has had a head cold since last Thursday and she was asking if Dr Renold Genta would prescribe something for her. I advised her she would need an appt since she hasnt been seen for this. Congestion, throat hurts, and tired. Where and how do you want to see her? [phone]

## 2021-11-02 ENCOUNTER — Other Ambulatory Visit: Payer: Self-pay

## 2021-11-02 MED ORDER — VARENICLINE TARTRATE 1 MG PO TABS
ORAL_TABLET | ORAL | 0 refills | Status: DC
Start: 2021-11-02 — End: 2022-01-03

## 2021-11-16 ENCOUNTER — Telehealth: Payer: Self-pay

## 2021-11-16 NOTE — Telephone Encounter (Signed)
Call placed to patient to clarify who her PCP is, as it is currently listed as Dr.Baxely, however she has a lab and follow-up appointment pending this month with Sherrie Mustache, NP  Side Note: Patient has a lab and physical appointment pending with Dr.Baxley in October 2023  Patient states she didn't really want to choose. Patient states she needs to think about this and call us back later this week.

## 2021-11-25 ENCOUNTER — Other Ambulatory Visit: Payer: Federal, State, Local not specified - PPO

## 2021-11-25 ENCOUNTER — Ambulatory Visit: Payer: Federal, State, Local not specified - PPO | Admitting: Orthopedic Surgery

## 2021-11-25 ENCOUNTER — Other Ambulatory Visit: Payer: Self-pay

## 2021-11-25 DIAGNOSIS — E039 Hypothyroidism, unspecified: Secondary | ICD-10-CM

## 2021-11-25 DIAGNOSIS — F1021 Alcohol dependence, in remission: Secondary | ICD-10-CM | POA: Diagnosis not present

## 2021-11-25 DIAGNOSIS — N951 Menopausal and female climacteric states: Secondary | ICD-10-CM

## 2021-11-25 DIAGNOSIS — F419 Anxiety disorder, unspecified: Secondary | ICD-10-CM | POA: Diagnosis not present

## 2021-11-25 DIAGNOSIS — F4320 Adjustment disorder, unspecified: Secondary | ICD-10-CM | POA: Diagnosis not present

## 2021-11-26 LAB — CBC WITH DIFFERENTIAL/PLATELET
Absolute Monocytes: 328 cells/uL (ref 200–950)
Basophils Absolute: 31 cells/uL (ref 0–200)
Basophils Relative: 0.8 %
Eosinophils Absolute: 0 cells/uL — ABNORMAL LOW (ref 15–500)
Eosinophils Relative: 0 %
HCT: 40.2 % (ref 35.0–45.0)
Hemoglobin: 13.3 g/dL (ref 11.7–15.5)
Lymphs Abs: 2059 cells/uL (ref 850–3900)
MCH: 29.2 pg (ref 27.0–33.0)
MCHC: 33.1 g/dL (ref 32.0–36.0)
MCV: 88.2 fL (ref 80.0–100.0)
MPV: 10.9 fL (ref 7.5–12.5)
Monocytes Relative: 8.4 %
Neutro Abs: 1482 cells/uL — ABNORMAL LOW (ref 1500–7800)
Neutrophils Relative %: 38 %
Platelets: 262 10*3/uL (ref 140–400)
RBC: 4.56 10*6/uL (ref 3.80–5.10)
RDW: 13.2 % (ref 11.0–15.0)
Total Lymphocyte: 52.8 %
WBC: 3.9 10*3/uL (ref 3.8–10.8)

## 2021-11-26 LAB — LIPID PANEL
Cholesterol: 218 mg/dL — ABNORMAL HIGH (ref <200)
HDL: 79 mg/dL (ref >=50)
LDL Cholesterol (Calc): 124 mg/dL (calc) — ABNORMAL HIGH
Non-HDL Cholesterol (Calc): 139 mg/dL (calc) — ABNORMAL HIGH (ref <130)
Total CHOL/HDL Ratio: 2.8 (calc) (ref <5.0)
Triglycerides: 56 mg/dL (ref <150)

## 2021-11-26 LAB — COMPLETE METABOLIC PANEL WITH GFR
AG Ratio: 2.3 (calc) (ref 1.0–2.5)
ALT: 9 U/L (ref 6–29)
AST: 16 U/L (ref 10–35)
Albumin: 4.6 g/dL (ref 3.6–5.1)
Alkaline phosphatase (APISO): 65 U/L (ref 31–125)
BUN: 8 mg/dL (ref 7–25)
CO2: 25 mmol/L (ref 20–32)
Calcium: 9.4 mg/dL (ref 8.6–10.2)
Chloride: 106 mmol/L (ref 98–110)
Creat: 0.84 mg/dL (ref 0.50–0.99)
Globulin: 2 g/dL (calc) (ref 1.9–3.7)
Glucose, Bld: 78 mg/dL (ref 65–99)
Potassium: 4.4 mmol/L (ref 3.5–5.3)
Sodium: 141 mmol/L (ref 135–146)
Total Bilirubin: 0.4 mg/dL (ref 0.2–1.2)
Total Protein: 6.6 g/dL (ref 6.1–8.1)
eGFR: 87 mL/min/{1.73_m2} (ref >=60)

## 2021-11-26 LAB — TSH: TSH: 0.57 mIU/L

## 2021-11-30 ENCOUNTER — Ambulatory Visit: Payer: Federal, State, Local not specified - PPO | Admitting: Nurse Practitioner

## 2021-12-06 ENCOUNTER — Encounter: Payer: Self-pay | Admitting: Nurse Practitioner

## 2021-12-06 ENCOUNTER — Ambulatory Visit: Payer: Federal, State, Local not specified - PPO | Admitting: Nurse Practitioner

## 2021-12-06 VITALS — BP 104/78 | HR 78 | Temp 97.5°F | Ht 68.0 in | Wt 127.0 lb

## 2021-12-06 DIAGNOSIS — M25511 Pain in right shoulder: Secondary | ICD-10-CM

## 2021-12-06 DIAGNOSIS — F419 Anxiety disorder, unspecified: Secondary | ICD-10-CM

## 2021-12-06 DIAGNOSIS — G4700 Insomnia, unspecified: Secondary | ICD-10-CM

## 2021-12-06 DIAGNOSIS — E039 Hypothyroidism, unspecified: Secondary | ICD-10-CM | POA: Diagnosis not present

## 2021-12-06 DIAGNOSIS — N951 Menopausal and female climacteric states: Secondary | ICD-10-CM

## 2021-12-06 DIAGNOSIS — G43909 Migraine, unspecified, not intractable, without status migrainosus: Secondary | ICD-10-CM

## 2021-12-06 DIAGNOSIS — R632 Polyphagia: Secondary | ICD-10-CM

## 2021-12-06 NOTE — Patient Instructions (Addendum)
To sign record release for Psychiatry and [facility] (thyroid records) office.    To sign record release for physician for women.     To take aleve twice daily for 7 days (take with food) Ice 2-3 times daily.

## 2021-12-06 NOTE — Progress Notes (Signed)
Careteam: Patient Care Team: Lauree Chandler, NP as PCP - General (Geriatric Medicine) Thornton Park, MD as Consulting Physician (Gastroenterology) Warren Danes, PA-C as Physician Assistant (Dermatology) Linda Hedges, DO as Consulting Physician (Obstetrics and Gynecology)  PLACE OF SERVICE:  [place] Directive information Does Patient Have a Medical Advance Directive?: No, Would patient like information on creating a medical advance directive?: No - Patient declined  No Known Allergies  Chief Complaint  Patient presents with   Medical Management of Chronic Issues    3 month follow-up and discuss labs.  Discuss need for HIV screening, hep c screening, mammogram, covid booster, and flu vaccine.      HPI: Patient is a 46 y.o. female for routine follow up.   Recent upper respiratory infection in mid July, started to chantix at that visit  Started on Chantix last month to quit vaping but has not been taking consistently so she is unsure of if it Is working or not. She plans to start taking consistently and quit vaping cold [place] starting today.   She has not been binge eating and states that her mood has been fine.   She continues on the estradiol patch and progesterone.  Denies menopausal symptoms  States her migraines have been better. Takes Maxalt a couple of times a month and continues to take [place] powder about once a week.   Her cholesterol was slightly elevated at last visit. We discussed avoiding fatty foods and dietary changes.   C/o rt shoulder pain after playing volley ball a couple of months. The pain is more in her bicep and it only hurts when she moves her arm. She has tried no interventions.  Review of Systems:  Review of Systems  Constitutional:  Negative for fever, malaise/fatigue and weight loss.  HENT:  Negative for congestion, ear pain and sore throat.   Eyes:  Negative for blurred vision and double vision.  Respiratory:   Negative for cough and shortness of breath.   Cardiovascular:  Negative for chest pain and leg swelling.  Gastrointestinal:  Negative for abdominal pain, constipation, diarrhea and heartburn.  Genitourinary:  Negative for dysuria and urgency.  Musculoskeletal:  Positive for myalgias.  Skin:  Negative for itching and rash.  Neurological:  Positive for headaches. Negative for weakness.  Psychiatric/Behavioral:  The patient is nervous/anxious.     Past Medical History:  Diagnosis Date   Anxiety    Hypothyroidism    Per new patient packet   Medical history non-contributory    Migraines    Substance abuse ([place])    Past Surgical History:  Procedure Laterality Date   CERVICAL POLYPECTOMY N/A 10/10/2012   Procedure: CERVICAL POLYPECTOMY;  Surgeon: Linda Hedges, DO;  Location: [facility];  Service: Gynecology;  Laterality: N/A;   COLONOSCOPY  2023   Per new patient packet   HYSTEROSCOPY WITH D & C N/A 10/10/2012   Procedure: DILATATION AND CURETTAGE /HYSTEROSCOPY;  Surgeon: Linda Hedges, DO;  Location: [facility];  Service: Gynecology;  Laterality: N/A;   MRI  2023   Tinnitus, [facility]) / Per new patient packet   WISDOM TOOTH EXTRACTION     Social History:   reports that she has never smoked. She has never used smokeless tobacco. She reports that she does not currently use alcohol. She reports that she does not use drugs.  Family History  Problem Relation Age of Onset   Migraines Mother    Hypothyroidism Mother  Heart disease Father    Alzheimer's disease Father    Hypertension Father    Diabetes Father    Hypertension Brother    Cancer Paternal Grandmother    Cancer Paternal Grandfather    Colon cancer Neg Hx    Colon polyps Neg Hx     Medications: Patient's Medications  New Prescriptions   No medications on file  Previous Medications   ESTRADIOL (VIVELLE-DOT) 0.05 MG/24HR PATCH    Place 1 patch onto the skin 2 (two) times a week.   LEVOTHYROXINE (SYNTHROID)  50 MCG TABLET    Take 1 tablet (50 mcg total) by mouth daily.   LIOTHYRONINE (CYTOMEL) 5 MCG TABLET    Take 10 mcg by mouth daily.   MELATONIN PO    Take 1 mg by mouth daily as needed.   MULTIPLE VITAMIN (MULTI-DAY VITAMINS PO)    Take by mouth daily.   NONFORMULARY OR COMPOUNDED ITEM    Testosterone 2% cream   PROGESTERONE (PROMETRIUM) 100 MG CAPSULE    Take 100 mg by mouth daily.   RIZATRIPTAN (MAXALT) 10 MG TABLET    TAKE 1 TABLET BY MOUTH AS NEEDED FOR MIGRAINE. MAY REPEAT IN 2 HOURS IF NEEDED   UNABLE TO FIND    as needed. Med Name: Gabriel Earing powder   VARENICLINE (CHANTIX) 1 MG TABLET    Take one half tablet daily for 3 days ,then one half tablet twice daily for 4 days, then one tablet twice daily  To assist in smoking cessation   VENLAFAXINE XR (EFFEXOR-XR) 150 MG 24 HR CAPSULE    Take 150 mg by mouth daily.  Modified Medications   No medications on file  Discontinued Medications   CYANOCOBALAMIN (B-12 PO)    Take by mouth as needed.    Physical Exam:  Vitals:   12/06/21 0822  BP: 104/78  Pulse: 78  Temp: (!) 97.5 F (36.4 C)  TempSrc: Temporal  SpO2: 98%  Weight: 57.6 kg  Height: '5\' 8"'$  (1.727 m)   Body mass index is 19.31 kg/m. Wt Readings from Last 3 Encounters:  12/06/21 57.6 kg  08/30/21 63.5 kg  02/19/21 58.2 kg    Physical Exam Constitutional:      General: She is not in acute distress.    Appearance: She is not toxic-appearing.  HENT:     Right Ear: External ear normal.     Left Ear: External ear normal.  Cardiovascular:     Rate and Rhythm: Normal rate and regular rhythm.     Pulses: Normal pulses.     Heart sounds: Normal heart sounds. No murmur heard. Pulmonary:     Effort: Pulmonary effort is normal. No respiratory distress.     Breath sounds: Normal breath sounds. No wheezing.  Abdominal:     General: Abdomen is flat. Bowel sounds are normal.     Palpations: Abdomen is soft.     Tenderness: There is no abdominal tenderness.  Musculoskeletal:         General: Tenderness present. Normal range of motion.     Right shoulder: Tenderness present. No swelling. Normal range of motion.     Left shoulder: Normal.  Skin:    General: Skin is warm.  Neurological:     General: No focal deficit present.     Mental Status: She is alert and oriented to person, place, and time.  Psychiatric:        Mood and Affect: Mood normal.  Behavior: Behavior normal.     Labs reviewed: Basic Metabolic Panel: Recent Labs    01/18/21 1209 03/16/21 0958 11/25/21 0936  NA  --   --  141  K  --   --  4.4  CL  --   --  106  CO2  --   --  25  GLUCOSE  --   --  78  BUN  --   --  8  CREATININE  --   --  0.84  CALCIUM  --   --  9.4  TSH 0.41 0.29* 0.57   Liver Function Tests: Recent Labs    11/25/21 0936  AST 16  ALT 9  BILITOT 0.4  PROT 6.6   No results for input(s): "LIPASE", "AMYLASE" in the last 8760 hours. No results for input(s): "AMMONIA" in the last 8760 hours. CBC: Recent Labs    11/25/21 0936  WBC 3.9  NEUTROABS 1,482*  HGB 13.3  HCT 40.2  MCV 88.2  PLT 262   Lipid Panel: Recent Labs    11/25/21 0936  CHOL 218*  HDL 79  LDLCALC 124*  TRIG 56  CHOLHDL 2.8   TSH: Recent Labs    01/18/21 1209 03/16/21 0958 11/25/21 0936  TSH 0.41 0.29* 0.57   A1C: No results found for: "HGBA1C"   Assessment/Plan 1. Acquired hypothyroidism -TSH normal -Continue levothyroxine and cytomel -[facility]  2. Menopausal state - continues estradiol patch and progesterone - follows with hormone clinic and GYN - denies current symptoms  3. Insomnia, unspecified type - stable -no longer takes remeron - continue melatonin  4. Binge eating - denies current episodes - follows with psychiatrist and therapist  5. Anxiety - stable  - continue effexor  6. Migraine without status migrainosus, not intractable, unspecified migraine type -stable - continue maxalt  7. Acute pain of right  shoulder - aleve BID for 7 days with food - Ice packs TID -to notify if symptoms worsen or fail to improve, will refer to ortho.   Return in about 6 months (around 06/08/2022) for routine follow up. .  Student- Waunita Schooner, RN I personally was present during the history, physical exam and medical decision-making activities of this service and have verified that the service and findings are accurately documented in the student's note  Latrease Kunde K. Muse, [facility] [phone]

## 2021-12-07 DIAGNOSIS — M8588 Other specified disorders of bone density and structure, other site: Secondary | ICD-10-CM | POA: Diagnosis not present

## 2021-12-07 DIAGNOSIS — N958 Other specified menopausal and perimenopausal disorders: Secondary | ICD-10-CM | POA: Diagnosis not present

## 2021-12-07 DIAGNOSIS — E039 Hypothyroidism, unspecified: Secondary | ICD-10-CM | POA: Diagnosis not present

## 2021-12-07 DIAGNOSIS — Z1231 Encounter for screening mammogram for malignant neoplasm of breast: Secondary | ICD-10-CM | POA: Diagnosis not present

## 2021-12-09 ENCOUNTER — Other Ambulatory Visit: Payer: Self-pay | Admitting: Obstetrics & Gynecology

## 2021-12-09 DIAGNOSIS — R928 Other abnormal and inconclusive findings on diagnostic imaging of breast: Secondary | ICD-10-CM

## 2021-12-10 DIAGNOSIS — F4320 Adjustment disorder, unspecified: Secondary | ICD-10-CM | POA: Diagnosis not present

## 2021-12-16 ENCOUNTER — Other Ambulatory Visit: Payer: Self-pay | Admitting: Obstetrics & Gynecology

## 2021-12-16 ENCOUNTER — Ambulatory Visit
Admission: RE | Admit: 2021-12-16 | Discharge: 2021-12-16 | Disposition: A | Discharge status: Home / Self Care | Payer: Federal, State, Local not specified - PPO | Source: Ambulatory Visit | Attending: Obstetrics & Gynecology | Admitting: Obstetrics & Gynecology

## 2021-12-16 DIAGNOSIS — R921 Mammographic calcification found on diagnostic imaging of breast: Secondary | ICD-10-CM

## 2021-12-16 DIAGNOSIS — R928 Other abnormal and inconclusive findings on diagnostic imaging of breast: Secondary | ICD-10-CM

## 2021-12-29 ENCOUNTER — Ambulatory Visit
Admission: RE | Admit: 2021-12-29 | Discharge: 2021-12-29 | Disposition: A | Discharge status: Home / Self Care | Payer: Federal, State, Local not specified - PPO | Source: Ambulatory Visit | Attending: Obstetrics & Gynecology | Admitting: Obstetrics & Gynecology

## 2021-12-29 DIAGNOSIS — R921 Mammographic calcification found on diagnostic imaging of breast: Secondary | ICD-10-CM | POA: Diagnosis not present

## 2021-12-29 DIAGNOSIS — N6012 Diffuse cystic mastopathy of left breast: Secondary | ICD-10-CM | POA: Diagnosis not present

## 2022-01-03 ENCOUNTER — Other Ambulatory Visit: Payer: Self-pay | Admitting: Internal Medicine

## 2022-01-13 DIAGNOSIS — F4320 Adjustment disorder, unspecified: Secondary | ICD-10-CM | POA: Diagnosis not present

## 2022-01-20 ENCOUNTER — Encounter: Payer: Self-pay | Admitting: Nurse Practitioner

## 2022-01-20 DIAGNOSIS — E039 Hypothyroidism, unspecified: Secondary | ICD-10-CM

## 2022-01-20 NOTE — Telephone Encounter (Signed)
Rx's Pended as in Current medication list. Does the Chantix directions need to be changed.

## 2022-01-21 MED ORDER — LEVOTHYROXINE SODIUM 50 MCG PO TABS: 50.0000 ug | ORAL_TABLET | Freq: Every day | ORAL | 3 refills | Status: DC

## 2022-01-21 MED ORDER — LIOTHYRONINE SODIUM 5 MCG PO TABS: 10.0000 ug | ORAL_TABLET | Freq: Every day | ORAL | 3 refills | Status: DC

## 2022-01-21 MED ORDER — VARENICLINE TARTRATE 1 MG PO TABS: ORAL_TABLET | ORAL | 0 refills | Status: DC

## 2022-01-25 ENCOUNTER — Other Ambulatory Visit: Payer: Federal, State, Local not specified - PPO

## 2022-01-31 ENCOUNTER — Encounter: Payer: Federal, State, Local not specified - PPO | Admitting: Internal Medicine

## 2022-02-10 DIAGNOSIS — F4320 Adjustment disorder, unspecified: Secondary | ICD-10-CM | POA: Diagnosis not present

## 2022-02-18 DIAGNOSIS — F411 Generalized anxiety disorder: Secondary | ICD-10-CM | POA: Diagnosis not present

## 2022-02-18 DIAGNOSIS — F4010 Social phobia, unspecified: Secondary | ICD-10-CM | POA: Diagnosis not present

## 2022-02-18 DIAGNOSIS — F331 Major depressive disorder, recurrent, moderate: Secondary | ICD-10-CM | POA: Diagnosis not present

## 2022-02-18 DIAGNOSIS — F1021 Alcohol dependence, in remission: Secondary | ICD-10-CM | POA: Diagnosis not present

## 2022-02-22 DIAGNOSIS — F4320 Adjustment disorder, unspecified: Secondary | ICD-10-CM | POA: Diagnosis not present

## 2022-03-02 ENCOUNTER — Encounter: Payer: Self-pay | Admitting: Nurse Practitioner

## 2022-03-02 DIAGNOSIS — G43909 Migraine, unspecified, not intractable, without status migrainosus: Secondary | ICD-10-CM

## 2022-03-02 NOTE — Telephone Encounter (Signed)
Message routed to PCP E[place], Jessica K, NP  

## 2022-03-03 ENCOUNTER — Other Ambulatory Visit: Payer: Self-pay | Admitting: Nurse Practitioner

## 2022-03-03 MED ORDER — UBRELVY 50 MG PO TABS: 50.0000 mg | ORAL_TABLET | ORAL | 0 refills | Status: DC | PRN

## 2022-03-03 NOTE — Telephone Encounter (Signed)
  Pharmacy comment: Alternative Requested:MEDICATION NOT COVERED, CALL FOR APPROVAL OR SEND ALTERNATIVE.

## 2022-03-04 NOTE — Telephone Encounter (Signed)
Please notify patient medication is not covered under her insurance.

## 2022-03-07 NOTE — Addendum Note (Signed)
Addended by: Lauree Chandler on: 03/07/2022 09:03 AM   Modules accepted: Orders

## 2022-03-17 DIAGNOSIS — F4320 Adjustment disorder, unspecified: Secondary | ICD-10-CM | POA: Diagnosis not present

## 2022-03-21 DIAGNOSIS — F1021 Alcohol dependence, in remission: Secondary | ICD-10-CM | POA: Diagnosis not present

## 2022-03-21 DIAGNOSIS — F4010 Social phobia, unspecified: Secondary | ICD-10-CM | POA: Diagnosis not present

## 2022-03-21 DIAGNOSIS — F330 Major depressive disorder, recurrent, mild: Secondary | ICD-10-CM | POA: Diagnosis not present

## 2022-03-21 DIAGNOSIS — F411 Generalized anxiety disorder: Secondary | ICD-10-CM | POA: Diagnosis not present

## 2022-04-22 DIAGNOSIS — F4320 Adjustment disorder, unspecified: Secondary | ICD-10-CM | POA: Diagnosis not present

## 2022-04-26 DIAGNOSIS — Z049 Encounter for examination and observation for unspecified reason: Secondary | ICD-10-CM | POA: Diagnosis not present

## 2022-04-26 DIAGNOSIS — G43719 Chronic migraine without aura, intractable, without status migrainosus: Secondary | ICD-10-CM | POA: Diagnosis not present

## 2022-04-26 DIAGNOSIS — Z79899 Other long term (current) drug therapy: Secondary | ICD-10-CM | POA: Diagnosis not present

## 2022-04-26 LAB — BASIC METABOLIC PANEL
BUN: 12 (ref 4–21)
Creatinine: 0.8 (ref 0.5–1.1)
Glucose: 99

## 2022-04-26 LAB — LIPID PANEL
Cholesterol: 189 (ref 0–200)
HDL: 74 — AB (ref 35–70)
LDL Cholesterol: 102
Triglycerides: 64 (ref 40–160)

## 2022-04-26 LAB — HEPATIC FUNCTION PANEL
ALT: 11 U/L (ref 7–35)
AST: 18 (ref 13–35)
Alkaline Phosphatase: 72 (ref 25–125)
Bilirubin, Total: 0.6

## 2022-04-26 LAB — COMPREHENSIVE METABOLIC PANEL
Albumin: 4.2 (ref 3.5–5.0)
Calcium: 10.2 (ref 8.7–10.7)

## 2022-04-26 LAB — POCT ERYTHROCYTE SEDIMENTATION RATE, NON-AUTOMATED: Sed Rate: 3

## 2022-05-05 DIAGNOSIS — F411 Generalized anxiety disorder: Secondary | ICD-10-CM | POA: Diagnosis not present

## 2022-05-05 DIAGNOSIS — G4700 Insomnia, unspecified: Secondary | ICD-10-CM | POA: Diagnosis not present

## 2022-05-05 DIAGNOSIS — F1021 Alcohol dependence, in remission: Secondary | ICD-10-CM | POA: Diagnosis not present

## 2022-05-05 DIAGNOSIS — F39 Unspecified mood [affective] disorder: Secondary | ICD-10-CM | POA: Diagnosis not present

## 2022-05-26 DIAGNOSIS — F4320 Adjustment disorder, unspecified: Secondary | ICD-10-CM | POA: Diagnosis not present

## 2022-06-02 DIAGNOSIS — Z713 Dietary counseling and surveillance: Secondary | ICD-10-CM | POA: Diagnosis not present

## 2022-06-08 DIAGNOSIS — F39 Unspecified mood [affective] disorder: Secondary | ICD-10-CM | POA: Diagnosis not present

## 2022-06-08 DIAGNOSIS — F1021 Alcohol dependence, in remission: Secondary | ICD-10-CM | POA: Diagnosis not present

## 2022-06-08 DIAGNOSIS — F411 Generalized anxiety disorder: Secondary | ICD-10-CM | POA: Diagnosis not present

## 2022-06-08 DIAGNOSIS — G4700 Insomnia, unspecified: Secondary | ICD-10-CM | POA: Diagnosis not present

## 2022-06-10 ENCOUNTER — Ambulatory Visit: Payer: Federal, State, Local not specified - PPO | Admitting: Nurse Practitioner

## 2022-06-10 ENCOUNTER — Encounter: Payer: Self-pay | Admitting: Nurse Practitioner

## 2022-06-10 VITALS — BP 120/76 | HR 88 | Temp 98.0°F | Ht 68.0 in | Wt 127.6 lb

## 2022-06-10 DIAGNOSIS — F1021 Alcohol dependence, in remission: Secondary | ICD-10-CM

## 2022-06-10 DIAGNOSIS — G43909 Migraine, unspecified, not intractable, without status migrainosus: Secondary | ICD-10-CM

## 2022-06-10 DIAGNOSIS — E039 Hypothyroidism, unspecified: Secondary | ICD-10-CM

## 2022-06-10 DIAGNOSIS — F419 Anxiety disorder, unspecified: Secondary | ICD-10-CM

## 2022-06-10 DIAGNOSIS — N951 Menopausal and female climacteric states: Secondary | ICD-10-CM

## 2022-06-10 DIAGNOSIS — G4700 Insomnia, unspecified: Secondary | ICD-10-CM

## 2022-06-10 DIAGNOSIS — R632 Polyphagia: Secondary | ICD-10-CM

## 2022-06-10 NOTE — Patient Instructions (Addendum)
Please sign a record release for [facility] on check out.    DENTIST  Dr Leontine Locket  [place]  [phone]

## 2022-06-10 NOTE — Progress Notes (Signed)
Careteam: Patient Care Team: Lauree Chandler, NP as PCP - General (Geriatric Medicine) Thornton Park, MD as Consulting Physician (Gastroenterology) Warren Danes, PA-C as Physician Assistant (Dermatology) Linda Hedges, DO as Consulting Physician (Obstetrics and Gynecology)  PLACE OF SERVICE:  [facility] Directive information    No Known Allergies  Chief Complaint  Patient presents with   Medical Management of Chronic Issues    Patient presents today for a 6 month follow-up   Quality Metric Gaps    HIV, Hep C screening, COVID#4     HPI: Patient is a 47 y.o. female for routine follow up. Had a lot of blood work done at the headache clinic- recommended a bunch of medication but she does not wish to be on a lot of medication so she is not taking anything- manages without.  Insurance does not cover whole bill and she will owe-   Continues to vape- not using chantix. Did not help. Unsure if she just not ready.   New psychiatrist plans to prescribe suboxone.    Review of Systems:  Review of Systems  Constitutional:  Negative for chills, fever and weight loss.  HENT:  Negative for tinnitus.   Respiratory:  Negative for cough, sputum production and shortness of breath.   Cardiovascular:  Negative for chest pain, palpitations and leg swelling.  Gastrointestinal:  Negative for abdominal pain, constipation, diarrhea and heartburn.  Genitourinary:  Negative for dysuria, frequency and urgency.  Musculoskeletal:  Negative for back pain, falls, joint pain and myalgias.  Skin: Negative.   Neurological:  Negative for dizziness and headaches.  Psychiatric/Behavioral:  Negative for depression and memory loss. The patient does not have insomnia.     Past Medical History:  Diagnosis Date   Anxiety    Hypothyroidism    Per new patient packet   Medical history non-contributory    Migraines    Substance abuse ([place])    Past Surgical History:  Procedure  Laterality Date   BREAST BIOPSY Right 06/26/2019   CERVICAL POLYPECTOMY N/A 10/10/2012   Procedure: CERVICAL POLYPECTOMY;  Surgeon: Linda Hedges, DO;  Location: [facility];  Service: Gynecology;  Laterality: N/A;   COLONOSCOPY  2023   Per new patient packet   HYSTEROSCOPY WITH D & C N/A 10/10/2012   Procedure: DILATATION AND CURETTAGE /HYSTEROSCOPY;  Surgeon: Linda Hedges, DO;  Location: [facility];  Service: Gynecology;  Laterality: N/A;   MRI  2023   Tinnitus, [facility]) / Per new patient packet   WISDOM TOOTH EXTRACTION     Social History:   reports that she has never smoked. She has never used smokeless tobacco. She reports that she does not currently use alcohol. She reports that she does not use drugs.  Family History  Problem Relation Age of Onset   Migraines Mother    Hypothyroidism Mother    Heart disease Father    Alzheimer's disease Father    Hypertension Father    Diabetes Father    Hypertension Brother    Cancer Paternal Grandmother    Cancer Paternal Grandfather    Colon cancer Neg Hx    Colon polyps Neg Hx     Medications: Patient's Medications  New Prescriptions   No medications on file  Previous Medications   ESTRADIOL (VIVELLE-DOT) 0.05 MG/24HR PATCH    Place 1 patch onto the skin 2 (two) times a week.   LEVOTHYROXINE (SYNTHROID) 50 MCG TABLET    Take 1 tablet (50 mcg  total) by mouth daily.   LIOTHYRONINE (CYTOMEL) 5 MCG TABLET    Take 2 tablets (10 mcg total) by mouth daily.   MELATONIN PO    Take 1 mg by mouth daily as needed.   MULTIPLE VITAMIN (MULTI-DAY VITAMINS PO)    Take by mouth daily.   NONFORMULARY OR COMPOUNDED ITEM    Testosterone 2% cream   PROGESTERONE (PROMETRIUM) 100 MG CAPSULE    Take 100 mg by mouth daily.   UBROGEPANT (UBRELVY) 50 MG TABS    Take 50 mg by mouth as needed (for maigraine, may rpeast dose x 1 after 2 hours).   UNABLE TO FIND    as needed. Med Name: Gabriel Earing powder   VARENICLINE (CHANTIX) 1 MG TABLET    TAKE 1/2 TAB  DAILY FOR 3 DAYS ,1/2 TAB TWICE DAILY FOR 4 DAYS,THEN 1 TAB TWICE DAILY TO QUIT SMOKING   VENLAFAXINE XR (EFFEXOR-XR) 150 MG 24 HR CAPSULE    Take 150 mg by mouth daily.  Modified Medications   No medications on file  Discontinued Medications   No medications on file    Physical Exam:  Vitals:   06/10/22 0833  BP: 120/76  Pulse: 88  Temp: 98 F (36.7 C)  SpO2: 99%  Weight: 127 lb 9.6 oz (57.9 kg)  Height: '5\' 8"'$  (1.727 m)   Body mass index is 19.4 kg/m. Wt Readings from Last 3 Encounters:  06/10/22 127 lb 9.6 oz (57.9 kg)  12/06/21 127 lb (57.6 kg)  08/30/21 140 lb (63.5 kg)    Physical Exam Constitutional:      General: She is not in acute distress.    Appearance: She is well-developed. She is not diaphoretic.  HENT:     Head: Normocephalic and atraumatic.     Mouth/Throat:     Pharynx: No oropharyngeal exudate.  Eyes:     Conjunctiva/sclera: Conjunctivae normal.     Pupils: Pupils are equal, round, and reactive to light.  Cardiovascular:     Rate and Rhythm: Normal rate and regular rhythm.     Heart sounds: Normal heart sounds.  Pulmonary:     Effort: Pulmonary effort is normal.     Breath sounds: Normal breath sounds.  Abdominal:     General: Bowel sounds are normal.     Palpations: Abdomen is soft.  Musculoskeletal:     Cervical back: Normal range of motion and neck supple.     Right lower leg: No edema.     Left lower leg: No edema.  Skin:    General: Skin is warm and dry.  Neurological:     Mental Status: She is alert.  Psychiatric:        Mood and Affect: Mood normal.     Labs reviewed: Basic Metabolic Panel: Recent Labs    11/25/21 0936  NA 141  K 4.4  CL 106  CO2 25  GLUCOSE 78  BUN 8  CREATININE 0.84  CALCIUM 9.4  TSH 0.57   Liver Function Tests: Recent Labs    11/25/21 0936  AST 16  ALT 9  BILITOT 0.4  PROT 6.6   No results for input(s): "LIPASE", "AMYLASE" in the last 8760 hours. No results for input(s): "AMMONIA" in the  last 8760 hours. CBC: Recent Labs    11/25/21 0936  WBC 3.9  NEUTROABS 1,482*  HGB 13.3  HCT 40.2  MCV 88.2  PLT 262   Lipid Panel: Recent Labs    11/25/21 0936  CHOL 218*  HDL 79  LDLCALC 124*  TRIG 56  CHOLHDL 2.8   TSH: Recent Labs    11/25/21 0936  TSH 0.57   A1C: No results found for: "HGBA1C"   Assessment/Plan 1. Migraine without status migrainosus, not intractable, unspecified migraine type -stable at this time. Not using medication recommended by headache clinic because she does not wish to be on that much medication. Continues with lifestyle modifications.  2. Acquired hypothyroidism -TSH at goal, continue on synthroid and cytomel  3. Menopausal state Ongoing and stable.   4. Insomnia, unspecified type Stable, continues on melatonin  5. Binge eating Stable  6. Anxiety Stable, following with psychiatrist at this time  7. Alcohol dependence in remission ([place]) Stable, continues to follow up with psychiatrist.    Return in about 6 months (around 12/09/2022) for CPE .  Carlos American. [place] [phone]

## 2022-06-13 ENCOUNTER — Telehealth: Payer: Self-pay | Admitting: Nurse Practitioner

## 2022-06-13 NOTE — Telephone Encounter (Signed)
-----   Message from Merlene Morse sent at 06/10/2022 10:13 AM EST ----- [facility] notes

## 2022-06-13 NOTE — Telephone Encounter (Signed)
Please abstract labs to chart

## 2022-06-14 ENCOUNTER — Encounter: Payer: Self-pay | Admitting: Nurse Practitioner

## 2022-06-14 NOTE — Telephone Encounter (Signed)
Labs abstracted as requested.

## 2022-06-20 ENCOUNTER — Encounter (HOSPITAL_BASED_OUTPATIENT_CLINIC_OR_DEPARTMENT_OTHER): Payer: Self-pay | Admitting: Pulmonary Disease

## 2022-06-20 ENCOUNTER — Ambulatory Visit (INDEPENDENT_AMBULATORY_CARE_PROVIDER_SITE_OTHER): Payer: Federal, State, Local not specified - PPO | Admitting: Pulmonary Disease

## 2022-06-20 VITALS — BP 104/70 | HR 75 | Ht 68.0 in | Wt 126.2 lb

## 2022-06-20 DIAGNOSIS — F5101 Primary insomnia: Secondary | ICD-10-CM

## 2022-06-20 NOTE — Progress Notes (Signed)
[facility]  Chief Complaint  Patient presents with   Consult    Trouble falling asleep and staying asleep    Past Surgical History:  She  has a past surgical history that includes Wisdom tooth extraction; Hysteroscopy with D & C (N/A, 10/10/2012); Cervical polypectomy (N/A, 10/10/2012); Colonoscopy (2023); MRI (2023); and Breast biopsy (Right, 06/26/2019).  Past Medical History:  Anxiety, Hypothyroidism, Migraine headache  Constitutional:  BP 104/70 (BP Location: Right Arm, Cuff Size: Normal)   Pulse 75   Ht '5\' 8"'$  (1.727 m)   Wt 126 lb 3.2 oz (57.2 kg)   SpO2 99%   BMI 19.19 kg/m   Brief Summary:  Jennifer Scott is a 47 y.o. female with insomnia.      Subjective:   She is followed by Dr. Orie Rout for chronic headaches.  She started having trouble with her sleep about 6 months ago.  She has trouble falling asleep and staying asleep.  She has tried remeron and trazodone.  More recently she has been using hydoxyzine.  This helps her fall asleep.  She still wakes up every hour or so.  She will then have trouble falling back to sleep.  She will sometimes get up and start doing house work.  She does have lots of things on her mind before going to bed.  She doesn't feel like she is depressed or anxious.  No significant life changes recently at home or work.  She started going through menopause about 2 years ago.  She gets episodes of feeling hot and then cold.  She sometimes has to sleep with a heater.  She keeps her hall light on at night for her dog, but she wears an eye mask at night.  She grinds her teeth sometimes.  She will occasionally get leg cramps.  She is not aware of snoring, sleep talking, or sleep walking.  She feels tired in the morning.  She was getting headaches in the morning, but these are better recently.  She will sometimes take melatonin when she has trouble falling back to sleep.  She naps during lunch for an  hour at work sometimes.  She still feels tired.  She goes to bed at 10 pm.  She can fall asleep quickly now that she uses hydroxyzine.  She gets out of bed at 7 am.  She prefers to sleep later, and sleeps until 10 am on weekends.  She doesn't drink alcohol.  She vaps at night as a means to stop smoking cigarettes.  Epworth score is 11 out of 24.   She denies sleep hallucinations, sleep paralysis, or cataplexy.   Physical Exam:   Appearance - well kempt   ENMT - no sinus tenderness, no oral exudate, no LAN, Mallampati 2 airway, no stridor  Respiratory - equal breath sounds bilaterally, no wheezing or rales  CV - s1s2 regular rate and rhythm, no murmurs  Ext - no clubbing, no edema  Skin - no rashes  Psych - normal mood and affect   Sleep Tests:    Social History:  She  reports that she has never smoked. She has never used smokeless tobacco. She reports that she does not currently use alcohol. She reports that she does not use drugs.  Family History:  Her family history includes Alzheimer's disease in her father; Cancer in her paternal grandfather and paternal grandmother; Diabetes in her father; Heart disease in her father; Hypertension in her brother and father;  Hypothyroidism in her mother; Migraines in her mother.    Discussion:  She has trouble falling asleep and staying asleep.  She recently went through menopause.  She doesn't have typical symptoms or clinical findings for sleep disordered breathing.  Assessment/Plan:   Insomnia. - advised her to keep a sleep diary for the next 2 weeks - proper sleep hygiene discussed - reviewed stimulus control, sleep restriction, and relaxation techniques (cognitive behavioral therapy) - okay to continue hydroxyzine for now; would like to avoid adding other sleep aide medications for now and hopefully will be able to have her stop hydroxyzine eventually - if her sleep difficulties persist, then might need to revisit doing a sleep  study but will defer this for now  Post-menopausal. - discussed how transition to menopause can contribute to sleep disruption - advised her to discuss with her gynecologist  Migraine headache. - followed by Dr. Orie Rout  Time Spent Involved in Patient Care on Day of Examination:  51 minutes  Follow up:   Patient Instructions  Keep a diary of your sleep pattern for the next two weeks  Try going to bed at midnight and wake up at 7 am  Avoid sleeping during the day  Follow up in 6 weeks  Medication List:   Allergies as of 06/20/2022   No Known Allergies      Medication List        Accurate as of June 20, 2022  9:57 AM. If you have any questions, ask your nurse or doctor.          STOP taking these medications    NONFORMULARY OR COMPOUNDED ITEM Stopped by: Chesley Mires, MD   Ubrelvy 50 MG Tabs Generic drug: Ubrogepant Stopped by: Chesley Mires, MD   varenicline 1 MG tablet Commonly known as: CHANTIX Stopped by: Chesley Mires, MD       TAKE these medications    estradiol 0.05 MG/24HR patch Commonly known as: VIVELLE-DOT Place 1 patch onto the skin 2 (two) times a week.   hydrOXYzine 25 MG tablet Commonly known as: ATARAX   levothyroxine 50 MCG tablet Commonly known as: SYNTHROID Take 1 tablet (50 mcg total) by mouth daily.   liothyronine 5 MCG tablet Commonly known as: CYTOMEL Take 2 tablets (10 mcg total) by mouth daily.   MELATONIN PO Take 1 mg by mouth daily as needed.   MULTI-DAY VITAMINS PO Take by mouth daily.   naltrexone 50 MG tablet Commonly known as: DEPADE Take by mouth daily.   progesterone 100 MG capsule Commonly known as: PROMETRIUM Take 100 mg by mouth daily.   UNABLE TO FIND as needed. Med Name: Gabriel Earing powder   venlafaxine XR 150 MG 24 hr capsule Commonly known as: EFFEXOR-XR Take 150 mg by mouth daily.        Signature:  Chesley Mires, MD [place] Pager - [phone] 06/20/2022,  9:57 AM

## 2022-06-20 NOTE — Patient Instructions (Signed)
Keep a diary of your sleep pattern for the next two weeks  Try going to bed at midnight and wake up at 7 am  Avoid sleeping during the day  Follow up in 6 weeks

## 2022-06-29 ENCOUNTER — Telehealth: Payer: Self-pay | Admitting: Pulmonary Disease

## 2022-06-29 NOTE — Telephone Encounter (Signed)
Pt's OV notes have been printed and faxed to provided fax number.

## 2022-06-29 NOTE — Telephone Encounter (Signed)
They need appt notes and if she had a sleep study:  [place] @ [phone] Fax is [phone]

## 2022-07-01 ENCOUNTER — Other Ambulatory Visit: Payer: Self-pay | Admitting: Nurse Practitioner

## 2022-07-01 DIAGNOSIS — E039 Hypothyroidism, unspecified: Secondary | ICD-10-CM

## 2022-07-13 DIAGNOSIS — F4320 Adjustment disorder, unspecified: Secondary | ICD-10-CM | POA: Diagnosis not present

## 2022-07-14 DIAGNOSIS — F39 Unspecified mood [affective] disorder: Secondary | ICD-10-CM | POA: Diagnosis not present

## 2022-07-14 DIAGNOSIS — F411 Generalized anxiety disorder: Secondary | ICD-10-CM | POA: Diagnosis not present

## 2022-07-14 DIAGNOSIS — G4700 Insomnia, unspecified: Secondary | ICD-10-CM | POA: Diagnosis not present

## 2022-07-14 DIAGNOSIS — F1021 Alcohol dependence, in remission: Secondary | ICD-10-CM | POA: Diagnosis not present

## 2022-08-08 ENCOUNTER — Ambulatory Visit (HOSPITAL_BASED_OUTPATIENT_CLINIC_OR_DEPARTMENT_OTHER): Payer: Federal, State, Local not specified - PPO | Admitting: Pulmonary Disease

## 2022-08-17 DIAGNOSIS — F4320 Adjustment disorder, unspecified: Secondary | ICD-10-CM | POA: Diagnosis not present

## 2022-08-23 DIAGNOSIS — N959 Unspecified menopausal and perimenopausal disorder: Secondary | ICD-10-CM | POA: Diagnosis not present

## 2022-09-08 DIAGNOSIS — F4320 Adjustment disorder, unspecified: Secondary | ICD-10-CM | POA: Diagnosis not present

## 2022-10-13 DIAGNOSIS — F1021 Alcohol dependence, in remission: Secondary | ICD-10-CM | POA: Diagnosis not present

## 2022-10-13 DIAGNOSIS — F411 Generalized anxiety disorder: Secondary | ICD-10-CM | POA: Diagnosis not present

## 2022-10-13 DIAGNOSIS — F39 Unspecified mood [affective] disorder: Secondary | ICD-10-CM | POA: Diagnosis not present

## 2022-10-13 DIAGNOSIS — G4700 Insomnia, unspecified: Secondary | ICD-10-CM | POA: Diagnosis not present

## 2022-12-02 DIAGNOSIS — F4320 Adjustment disorder, unspecified: Secondary | ICD-10-CM | POA: Diagnosis not present

## 2022-12-09 ENCOUNTER — Ambulatory Visit: Payer: Federal, State, Local not specified - PPO | Admitting: Nurse Practitioner

## 2022-12-09 ENCOUNTER — Encounter: Payer: Self-pay | Admitting: Nurse Practitioner

## 2022-12-09 VITALS — BP 110/72 | HR 72 | Temp 97.5°F | Resp 16 | Ht 68.0 in | Wt 129.0 lb

## 2022-12-09 DIAGNOSIS — E039 Hypothyroidism, unspecified: Secondary | ICD-10-CM | POA: Diagnosis not present

## 2022-12-09 DIAGNOSIS — Z720 Tobacco use: Secondary | ICD-10-CM

## 2022-12-09 DIAGNOSIS — Z1321 Encounter for screening for nutritional disorder: Secondary | ICD-10-CM | POA: Diagnosis not present

## 2022-12-09 DIAGNOSIS — F1021 Alcohol dependence, in remission: Secondary | ICD-10-CM

## 2022-12-09 DIAGNOSIS — Z0000 Encounter for general adult medical examination without abnormal findings: Secondary | ICD-10-CM

## 2022-12-09 DIAGNOSIS — E2839 Other primary ovarian failure: Secondary | ICD-10-CM | POA: Diagnosis not present

## 2022-12-09 DIAGNOSIS — E559 Vitamin D deficiency, unspecified: Secondary | ICD-10-CM | POA: Diagnosis not present

## 2022-12-09 DIAGNOSIS — G43909 Migraine, unspecified, not intractable, without status migrainosus: Secondary | ICD-10-CM

## 2022-12-09 DIAGNOSIS — G4700 Insomnia, unspecified: Secondary | ICD-10-CM | POA: Diagnosis not present

## 2022-12-09 NOTE — Progress Notes (Signed)
Provider: Sharon Seller, NP  Patient Care Team: Sharon Seller, NP as PCP - General (Geriatric Medicine) Tressia Danas, MD (Inactive) as Consulting Physician (Gastroenterology) Glyn Ade, PA-C as Physician Assistant (Dermatology) Mitchel Honour, DO as Consulting Physician (Obstetrics and Gynecology)  Extended Emergency Contact Information Primary Emergency Contact: Hefner,Nora Address: [address]          [place], [place] [address] [place] of [place] Home Phone: [phone] Relation: Friend No Known Allergies Code Status: FULL Goals of Care: Advanced Directive information    12/09/2022    8:11 AM  Advanced Directives  Does Patient Have a Medical Advance Directive? No  Would patient like information on creating a medical advance directive? No - Patient declined     Chief Complaint  Patient presents with   Medication Management    6 month follow up   Immunizations    Discuss the need for Influenza vaccine, and Covid Booster.   Health Maintenance    Discuss the need for Hepatitis C Screening, and HIV Screening.     HPI: Patient is a 47 y.o. female seen in today for an wellness exam at [facility]   She is seeing psychiatrist only now. Taking trazodone for sleep which helps but making her groggy.  Overall more tired.   Doing well.   Stopped vaping for 2 months and did well but stated back.  Using a patch now.        12/06/2021    8:27 AM 08/30/2021    1:29 PM 12/04/2020   10:21 AM  Depression screen PHQ 2/9  Decreased Interest 0 0 0  Down, Depressed, Hopeless 0 0 0  PHQ - 2 Score 0 0 0       12/04/2020   10:21 AM 08/30/2021    1:29 PM 12/06/2021    8:27 AM 12/09/2022    8:11 AM  Fall Risk  Falls in the past year? 0 0 0 0  Was there an injury with Fall? 0 0 0 0  Fall Risk Category Calculator 0 0 0 0  Fall Risk Category (Retired) Low Low Low   (RETIRED) Patient Fall Risk Level Low fall risk Low fall risk Low fall risk   Patient at Risk  for Falls Due to No Fall Risks No Fall Risks No Fall Risks No Fall Risks  Fall risk Follow up Falls evaluation completed Falls evaluation completed Falls evaluation completed Falls evaluation completed;Education provided;Falls prevention discussed       No data to display           Health Maintenance  Topic Date Due   HIV Screening  Never done   Hepatitis C Screening  Never done   COVID-19 Vaccine (4 - 2023-24 season) 12/17/2021   INFLUENZA VACCINE  11/17/2022   MAMMOGRAM  12/17/2022   PAP SMEAR-Modifier  05/08/2023   Colonoscopy  02/04/2028   DTaP/Tdap/Td (3 - Td or Tdap) 08/20/2030   HPV VACCINES  Aged Out    Past Medical History:  Diagnosis Date   Anxiety    Hypothyroidism    Per new patient packet   Medical history non-contributory    Migraines    Substance abuse ([facility])     Past Surgical History:  Procedure Laterality Date   BREAST BIOPSY Right 06/26/2019   CERVICAL POLYPECTOMY N/A 10/10/2012   Procedure: CERVICAL POLYPECTOMY;  Surgeon: Mitchel Honour, DO;  Location: [facility];  Service: Gynecology;  Laterality: N/A;   COLONOSCOPY  2023   Per new  patient packet   HYSTEROSCOPY WITH D & C N/A 10/10/2012   Procedure: DILATATION AND CURETTAGE /HYSTEROSCOPY;  Surgeon: Mitchel Honour, DO;  Location: [facility];  Service: Gynecology;  Laterality: N/A;   MRI  2023   Tinnitus, [facility]) / Per new patient packet   WISDOM TOOTH EXTRACTION      Social History   Socioeconomic History   Marital status: Single    Spouse name: Not on file   Number of children: Not on file   Years of education: Not on file   Highest education level: Not on file  Occupational History   Not on file  Tobacco Use   Smoking status: Never   Smokeless tobacco: Never  Vaping Use   Vaping status: Some Days  Substance and Sexual Activity   Alcohol use: Not Currently   Drug use: No   Sexual activity: Not on file    Comment: not asked  Other Topics Concern   Not on file  Social  History Narrative   Diet: "Could use some work"      Caffeine: Yes      Married, if yes what year: Single      Do you live in a house, apartment, assisted living, condo, trailer, ect: House      Is it one or more stories: Yes      How many persons live in your home? 1      Pets:1 dog      Highest level or education completed: Bachelor Degree      Current/Past profession: Engineer, drilling      Exercise:  Yes                Type and how often: Weekly/Walk         Living Will: No   DNR: No   POA/HPOA: No      Functional Status:   Do you have difficulty bathing or dressing yourself? No   Do you have difficulty preparing food or eating? No    Do you have difficulty managing your medications? No   Do you have difficulty managing your finances? No   Do you have difficulty affording your medications? No      Social Determinants of Corporate investment banker Strain: Not on file  Food Insecurity: Not on file  Transportation Needs: Not on file  Physical Activity: Not on file  Stress: Not on file  Social Connections: Not on file    Family History  Problem Relation Age of Onset   Migraines Mother    Hypothyroidism Mother    Heart disease Father    Alzheimer's disease Father    Hypertension Father    Diabetes Father    Hypertension Brother    Cancer Paternal Grandmother    Cancer Paternal Grandfather    Colon cancer Neg Hx    Colon polyps Neg Hx     Review of Systems:  Review of Systems  Constitutional:  Negative for chills, fever and weight loss.  HENT:  Negative for tinnitus.   Respiratory:  Negative for cough, sputum production and shortness of breath.   Cardiovascular:  Negative for chest pain, palpitations and leg swelling.  Gastrointestinal:  Negative for abdominal pain, constipation, diarrhea and heartburn.  Genitourinary:  Negative for dysuria, frequency and urgency.  Musculoskeletal:  Negative for back pain, falls, joint pain and myalgias.  Skin:  Negative.   Neurological:  Negative for dizziness and headaches.  Psychiatric/Behavioral:  Negative for depression  and memory loss. The patient does not have insomnia.      Allergies as of 12/09/2022   No Known Allergies      Medication List        Accurate as of December 09, 2022  8:17 AM. If you have any questions, ask your nurse or doctor.          STOP taking these medications    estradiol 0.05 MG/24HR patch Commonly known as: VIVELLE-DOT Stopped by: Sharon Seller   hydrOXYzine 25 MG tablet Commonly known as: ATARAX Stopped by: Sharon Seller   progesterone 100 MG capsule Commonly known as: PROMETRIUM Stopped by: Sharon Seller       TAKE these medications    levothyroxine 50 MCG tablet Commonly known as: SYNTHROID TAKE 1 TABLET BY MOUTH EVERY DAY   liothyronine 5 MCG tablet Commonly known as: CYTOMEL Take 2 tablets (10 mcg total) by mouth daily.   MELATONIN PO Take 1 mg by mouth daily as needed.   MULTI-DAY VITAMINS PO Take by mouth daily.   naltrexone 50 MG tablet Commonly known as: DEPADE Take by mouth daily.   UNABLE TO FIND as needed. Med Name: Marlin Canary powder   venlafaxine XR 150 MG 24 hr capsule Commonly known as: EFFEXOR-XR Take 150 mg by mouth daily.          Physical Exam: Vitals:   12/09/22 0810  BP: 110/72  Pulse: 72  Resp: 16  Temp: (!) 97.5 F (36.4 C)  SpO2: 98%  Weight: 129 lb (58.5 kg)  Height: 5\' 8"  (1.727 m)   Body mass index is 19.61 kg/m. Wt Readings from Last 3 Encounters:  12/09/22 129 lb (58.5 kg)  06/20/22 126 lb 3.2 oz (57.2 kg)  06/10/22 127 lb 9.6 oz (57.9 kg)    Physical Exam Constitutional:      General: She is not in acute distress.    Appearance: She is well-developed. She is not diaphoretic.  HENT:     Head: Normocephalic and atraumatic.     Mouth/Throat:     Pharynx: No oropharyngeal exudate.  Eyes:     Conjunctiva/sclera: Conjunctivae normal.     Pupils: Pupils are equal,  round, and reactive to light.  Cardiovascular:     Rate and Rhythm: Normal rate and regular rhythm.     Heart sounds: Normal heart sounds.  Pulmonary:     Effort: Pulmonary effort is normal.     Breath sounds: Normal breath sounds.  Abdominal:     General: Bowel sounds are normal.     Palpations: Abdomen is soft.  Musculoskeletal:     Cervical back: Normal range of motion and neck supple.     Right lower leg: No edema.     Left lower leg: No edema.  Skin:    General: Skin is warm and dry.  Neurological:     Mental Status: She is alert and oriented to person, place, and time. Mental status is at baseline.  Psychiatric:        Mood and Affect: Mood normal.     Labs reviewed: Basic Metabolic Panel: Recent Labs    04/26/22 0000  BUN 12  CREATININE 0.8  CALCIUM 10.2   Liver Function Tests: Recent Labs    04/26/22 0000  AST 18  ALT 11  ALKPHOS 72.0  ALBUMIN 4.2   No results for input(s): "LIPASE", "AMYLASE" in the last 8760 hours. No results for input(s): "AMMONIA" in the last 8760 hours. CBC:  No results for input(s): "WBC", "NEUTROABS", "HGB", "HCT", "MCV", "PLT" in the last 8760 hours. Lipid Panel: Recent Labs    04/26/22 0000  CHOL 189  HDL 74*  LDLCALC 102  TRIG 64   No results found for: "HGBA1C"  Procedures: No results found.  Assessment/Plan 1. Preventative health care -wellness visit completed today, The patient was counseled regarding the appropriate use of alcohol, regular self-examination of the breasts on a monthly basis, prevention of dental and periodontal disease, diet, regular sustained exercise for at least 30 minutes 5 times per week, routine screening interval for mammogram as recommended by the American Cancer Society and ACOG, importance of regular PAP smears,  and recommended schedule for GI hemoccult testing, colonoscopy, cholesterol, thyroid and diabetes screening. - COMPLETE METABOLIC PANEL WITH GFR - CBC with Differential/Platelet -  TSH  2. Encounter for vitamin deficiency screening - Vitamin D, 25-hydroxy  3. Acquired hypothyroidism -continues on cytomel 10 mcg daily  - TSH  4. Insomnia, unspecified type Stable on current regimen  5. Alcohol dependence in remission ([facility]) Continues on naltrexone, continues in remission   6. Migraine without status migrainosus, not intractable, unspecified migraine type Stable at this time.   7. Tobacco abuse -encouraged cessation   Next appt: yearly  [place] K. Biagio Borg  [facility] [phone]

## 2022-12-10 LAB — CBC WITH DIFFERENTIAL/PLATELET
Absolute Monocytes: 421 {cells}/uL (ref 200–950)
Basophils Absolute: 22 {cells}/uL (ref 0–200)
Basophils Relative: 0.5 %
Eosinophils Absolute: 0 {cells}/uL — ABNORMAL LOW (ref 15–500)
Eosinophils Relative: 0 %
HCT: 38.1 % (ref 35.0–45.0)
Hemoglobin: 12.6 g/dL (ref 11.7–15.5)
Lymphs Abs: 2004 {cells}/uL (ref 850–3900)
MCH: 29.2 pg (ref 27.0–33.0)
MCHC: 33.1 g/dL (ref 32.0–36.0)
MCV: 88.4 fL (ref 80.0–100.0)
MPV: 10.5 fL (ref 7.5–12.5)
Monocytes Relative: 9.8 %
Neutro Abs: 1853 {cells}/uL (ref 1500–7800)
Neutrophils Relative %: 43.1 %
Platelets: 231 Thousand/uL (ref 140–400)
RBC: 4.31 Million/uL (ref 3.80–5.10)
RDW: 12.8 % (ref 11.0–15.0)
Total Lymphocyte: 46.6 %
WBC: 4.3 Thousand/uL (ref 3.8–10.8)

## 2022-12-10 LAB — COMPLETE METABOLIC PANEL WITH GFR
AG Ratio: 2.4 (calc) (ref 1.0–2.5)
ALT: 10 U/L (ref 6–29)
AST: 15 U/L (ref 10–35)
Albumin: 4.7 g/dL (ref 3.6–5.1)
Alkaline phosphatase (APISO): 55 U/L (ref 31–125)
BUN: 14 mg/dL (ref 7–25)
CO2: 30 mmol/L (ref 20–32)
Calcium: 10.3 mg/dL — ABNORMAL HIGH (ref 8.6–10.2)
Chloride: 103 mmol/L (ref 98–110)
Creat: 0.79 mg/dL (ref 0.50–0.99)
Globulin: 2 g/dL (ref 1.9–3.7)
Glucose, Bld: 91 mg/dL (ref 65–99)
Potassium: 4.8 mmol/L (ref 3.5–5.3)
Sodium: 141 mmol/L (ref 135–146)
Total Bilirubin: 0.3 mg/dL (ref 0.2–1.2)
Total Protein: 6.7 g/dL (ref 6.1–8.1)
eGFR: 93 mL/min/{1.73_m2} (ref >=60)

## 2022-12-10 LAB — TSH: TSH: 1.66 m[IU]/L

## 2022-12-10 LAB — VITAMIN D 25 HYDROXY (VIT D DEFICIENCY, FRACTURES): Vit D, 25-Hydroxy: 49 ng/mL (ref 30–100)

## 2022-12-14 ENCOUNTER — Encounter: Payer: Self-pay | Admitting: Nurse Practitioner

## 2023-01-17 DIAGNOSIS — F4320 Adjustment disorder, unspecified: Secondary | ICD-10-CM | POA: Diagnosis not present

## 2023-01-20 DIAGNOSIS — F39 Unspecified mood [affective] disorder: Secondary | ICD-10-CM | POA: Diagnosis not present

## 2023-01-20 DIAGNOSIS — F411 Generalized anxiety disorder: Secondary | ICD-10-CM | POA: Diagnosis not present

## 2023-01-20 DIAGNOSIS — F1021 Alcohol dependence, in remission: Secondary | ICD-10-CM | POA: Diagnosis not present

## 2023-01-20 DIAGNOSIS — G4700 Insomnia, unspecified: Secondary | ICD-10-CM | POA: Diagnosis not present

## 2023-01-30 ENCOUNTER — Encounter: Payer: Self-pay | Admitting: Nurse Practitioner

## 2023-01-30 NOTE — Telephone Encounter (Signed)
Patient requested records to be faxed to Dr. Melrose Nakayama. Called his office [phone] and asked for fax #: [phone]  Records faxed as patient requested.

## 2023-02-01 DIAGNOSIS — F4320 Adjustment disorder, unspecified: Secondary | ICD-10-CM | POA: Diagnosis not present

## 2023-02-21 DIAGNOSIS — F4320 Adjustment disorder, unspecified: Secondary | ICD-10-CM | POA: Diagnosis not present

## 2023-02-22 ENCOUNTER — Other Ambulatory Visit: Payer: Self-pay | Admitting: Nurse Practitioner

## 2023-02-22 DIAGNOSIS — E039 Hypothyroidism, unspecified: Secondary | ICD-10-CM

## 2023-02-22 NOTE — Telephone Encounter (Signed)
Patient medication has allergy contraindications. Medication pend and sent to PCP Janyth Contes Janene Harvey, NP for approval.

## 2023-02-28 ENCOUNTER — Other Ambulatory Visit: Payer: Self-pay | Admitting: Nurse Practitioner

## 2023-02-28 DIAGNOSIS — E039 Hypothyroidism, unspecified: Secondary | ICD-10-CM

## 2023-03-01 ENCOUNTER — Other Ambulatory Visit: Payer: Self-pay | Admitting: Nurse Practitioner

## 2023-03-01 DIAGNOSIS — E039 Hypothyroidism, unspecified: Secondary | ICD-10-CM

## 2023-03-01 NOTE — Telephone Encounter (Signed)
  Pharmacy comment: Product Backordered/Unavailable:MFG BACKORDER. CAN WE CHANGE.

## 2023-03-01 NOTE — Telephone Encounter (Signed)
  Pharmacy comment: Script Clarification:YOU NEED TO GIVE [place] DIRECTIONS FOR THIS SCRIPT.

## 2023-03-13 DIAGNOSIS — F4320 Adjustment disorder, unspecified: Secondary | ICD-10-CM | POA: Diagnosis not present

## 2023-04-03 DIAGNOSIS — F4320 Adjustment disorder, unspecified: Secondary | ICD-10-CM | POA: Diagnosis not present

## 2023-04-19 DIAGNOSIS — F909 Attention-deficit hyperactivity disorder, unspecified type: Secondary | ICD-10-CM

## 2023-04-19 HISTORY — DX: Attention-deficit hyperactivity disorder, unspecified type: F90.9

## 2023-04-20 DIAGNOSIS — F4320 Adjustment disorder, unspecified: Secondary | ICD-10-CM | POA: Diagnosis not present

## 2023-05-02 DIAGNOSIS — F4320 Adjustment disorder, unspecified: Secondary | ICD-10-CM | POA: Diagnosis not present

## 2023-05-11 DIAGNOSIS — R41840 Attention and concentration deficit: Secondary | ICD-10-CM | POA: Diagnosis not present

## 2023-05-17 DIAGNOSIS — F4320 Adjustment disorder, unspecified: Secondary | ICD-10-CM | POA: Diagnosis not present

## 2023-05-19 DIAGNOSIS — F411 Generalized anxiety disorder: Secondary | ICD-10-CM | POA: Diagnosis not present

## 2023-05-19 DIAGNOSIS — Z79899 Other long term (current) drug therapy: Secondary | ICD-10-CM | POA: Diagnosis not present

## 2023-05-19 DIAGNOSIS — F900 Attention-deficit hyperactivity disorder, predominantly inattentive type: Secondary | ICD-10-CM | POA: Diagnosis not present

## 2023-06-15 DIAGNOSIS — F900 Attention-deficit hyperactivity disorder, predominantly inattentive type: Secondary | ICD-10-CM | POA: Diagnosis not present

## 2023-06-20 DIAGNOSIS — F411 Generalized anxiety disorder: Secondary | ICD-10-CM | POA: Diagnosis not present

## 2023-06-20 DIAGNOSIS — F900 Attention-deficit hyperactivity disorder, predominantly inattentive type: Secondary | ICD-10-CM | POA: Diagnosis not present

## 2023-06-20 DIAGNOSIS — F4320 Adjustment disorder, unspecified: Secondary | ICD-10-CM | POA: Diagnosis not present

## 2023-06-20 DIAGNOSIS — Z79899 Other long term (current) drug therapy: Secondary | ICD-10-CM | POA: Diagnosis not present

## 2023-07-06 DIAGNOSIS — F4320 Adjustment disorder, unspecified: Secondary | ICD-10-CM | POA: Diagnosis not present

## 2023-07-19 DIAGNOSIS — F4320 Adjustment disorder, unspecified: Secondary | ICD-10-CM | POA: Diagnosis not present

## 2023-08-01 DIAGNOSIS — F4320 Adjustment disorder, unspecified: Secondary | ICD-10-CM | POA: Diagnosis not present

## 2023-08-10 ENCOUNTER — Ambulatory Visit: Admitting: Family

## 2023-08-10 ENCOUNTER — Ambulatory Visit: Payer: Self-pay

## 2023-08-10 ENCOUNTER — Encounter: Payer: Self-pay | Admitting: Family

## 2023-08-10 VITALS — BP 118/74 | HR 80 | Temp 97.9°F | Resp 19 | Ht 68.0 in | Wt 128.0 lb

## 2023-08-10 DIAGNOSIS — M542 Cervicalgia: Secondary | ICD-10-CM | POA: Diagnosis not present

## 2023-08-10 MED ORDER — CYCLOBENZAPRINE HCL 5 MG PO TABS: 5.0000 mg | ORAL_TABLET | Freq: Three times a day (TID) | ORAL | 1 refills | Status: DC | PRN

## 2023-08-10 NOTE — Telephone Encounter (Signed)
 See Triage Notes from Triage Nurse FYI.  Message sent to Ngetich, Donalee Citrin, NP

## 2023-08-10 NOTE — Patient Instructions (Signed)
-   Please get neck X-ray at [facility] imaging at [facility] then will call you with results.

## 2023-08-10 NOTE — Progress Notes (Signed)
 Provider: Imogean Ciampa FNP-C  Verma Gobble, NP  Patient Care Team: Verma Gobble, NP as PCP - General (Geriatric Medicine) Lindle Rhea, MD (Inactive) as Consulting Physician (Gastroenterology) Dorthey Gave, PA-C as Physician Assistant (Dermatology) Dyanna Glasgow, DO as Consulting Physician (Obstetrics and Gynecology)  Extended Emergency Contact Information Primary Emergency Contact: Hefner,Nora Address: [address]          [place], [place] [address] [place] Home Phone: [phone] Relation: Friend  Code Status:  Full Cod e Goals of care: Advanced Directive information    08/10/2023    1:15 PM  Advanced Directives  Does Patient Have a Medical Advance Directive? No  Would patient like information on creating a medical advance directive? No - Patient declined     Chief Complaint  Patient presents with   Acute Visit    Neck pain      Discussed the use of AI scribe software for clinical note transcription with the patient, who gave verbal consent to proceed.  History of Present Illness   Jennifer Scott is a 48 year old female who presents with neck pain following a self-defense training incident.  She experienced neck pain after participating in a self-defense training session at work yesterday. During the session, she executed a technique that resulted in her neck hitting the edge of the mat, accompanied by a cracking sound. The pain is primarily located on the right side of her neck, with tenderness noted in that area. The pain was more intense today compared to yesterday.  She denies any dizziness but mentions feeling lightheaded and has a headache, which she states is not unusual for her. She has not noticed any bruising or swelling in the affected area. She has not taken any medication for the pain today, although she did take a Goody powder yesterday, which did not provide significant relief.  No dizziness, nausea, or  worsening of her usual headaches. Tenderness on the right side of her neck, particularly when turning her head to the left, which is more painful than turning to the right. No pain along her spine or on the left side of her neck.  She has not taken any new medications recently and has no known allergies to medications or food. She confirms that there is no chance of pregnancy.         Past Medical History:  Diagnosis Date   ADHD 04/2023   Anxiety    Hypothyroidism    Per new patient packet   Medical history non-contributory    Migraines    Substance abuse [facility])    Past Surgical History:  Procedure Laterality Date   BREAST BIOPSY Right 06/26/2019   CERVICAL POLYPECTOMY N/A 10/10/2012   Procedure: CERVICAL POLYPECTOMY;  Surgeon: Dyanna Glasgow, DO;  Location: [facility];  Service: Gynecology;  Laterality: N/A;   COLONOSCOPY  2023   Per new patient packet   HYSTEROSCOPY WITH D & C N/A 10/10/2012   Procedure: DILATATION AND CURETTAGE /HYSTEROSCOPY;  Surgeon: Dyanna Glasgow, DO;  Location: [facility];  Service: Gynecology;  Laterality: N/A;   MRI  2023   Tinnitus, [facility]) / Per new patient packet   WISDOM TOOTH EXTRACTION      No Known Allergies  Outpatient Encounter Medications as of 08/10/2023  Medication Sig   cyclobenzaprine  (FLEXERIL ) 5 MG tablet Take 1 tablet (5 mg total) by mouth 3 (three) times daily as needed for muscle spasms.   levothyroxine  (LEVOXYL ) 50 MCG  tablet Take 1 tablet (50 mcg total) by mouth daily before breakfast.   liothyronine  (CYTOMEL ) 5 MCG tablet Take 2 tablets (10 mcg total) by mouth daily.   lisdexamfetamine (VYVANSE) 40 MG capsule Take 40 mg by mouth daily.   Multiple Vitamin (MULTI-DAY VITAMINS PO) Take by mouth daily.   UNABLE TO FIND as needed. Med Name: Juluis Ok powder   venlafaxine XR (EFFEXOR-XR) 150 MG 24 hr capsule Take 150 mg by mouth daily.   MELATONIN PO Take 1 mg by mouth daily as needed. (Patient not taking: Reported on 08/10/2023)    naltrexone (DEPADE) 50 MG tablet Take by mouth daily. (Patient not taking: Reported on 08/10/2023)   No facility-administered encounter medications on file as of 08/10/2023.    Review of Systems  Constitutional:  Negative for appetite change, chills, fatigue, fever and unexpected weight change.  Eyes:  Negative for pain, discharge, redness, itching and visual disturbance.  Respiratory:  Negative for cough, chest tightness, shortness of breath and wheezing.   Cardiovascular:  Negative for chest pain, palpitations and leg swelling.  Gastrointestinal:  Negative for nausea and vomiting.  Musculoskeletal:  Positive for neck pain. Negative for arthralgias, back pain, gait problem, joint swelling, myalgias and neck stiffness.       Right side and back of the neck pain   Skin:  Negative for color change, pallor, rash and wound.  Neurological:  Negative for dizziness, syncope, speech difficulty, weakness, light-headedness, numbness and headaches.    Immunization History  Administered Date(s) Administered   Influenza, Quadrivalent, Recombinant, Inj, Pf 02/19/2020   Influenza,inj,Quad PF,6+ Mos 01/19/2021   PFIZER(Purple Top)SARS-COV-2 Vaccination 06/27/2019, 07/22/2019, 03/30/2020   Tdap 05/07/2020, 08/19/2020   Pertinent  Health Maintenance Due  Topic Date Due   MAMMOGRAM  12/17/2022   INFLUENZA VACCINE  11/17/2023   Colonoscopy  02/04/2028      12/04/2020   10:21 AM 08/30/2021    1:29 PM 12/06/2021    8:27 AM 12/09/2022    8:11 AM  Fall Risk  Falls in the past year? 0 0 0 0  Was there an injury with Fall? 0 0 0 0  Fall Risk Category Calculator 0 0 0 0  Fall Risk Category (Retired) Low Low Low   (RETIRED) Patient Fall Risk Level Low fall risk Low fall risk Low fall risk   Patient at Risk for Falls Due to No Fall Risks No Fall Risks No Fall Risks No Fall Risks  Fall risk Follow up Falls evaluation completed Falls evaluation completed Falls evaluation completed Falls evaluation  completed;Education provided;Falls prevention discussed   Functional Status Survey:    Vitals:   08/10/23 1317  BP: 118/74  Pulse: 80  Resp: 19  Temp: 97.9 F (36.6 C)  SpO2: 98%  Weight: 128 lb (58.1 kg)  Height: 5\' 8"  (1.727 m)   Body mass index is 19.46 kg/m. Physical Exam  VITALS: T- 97.9, P- 80, BP- 118/74, SaO2- 98% MEASUREMENTS: Weight- 128. GENERAL: Alert, cooperative, well developed, no acute distress. HEENT: Normocephalic, normal oropharynx, moist mucous membranes. NECK: Tenderness on the right side, no tenderness on the back, pain on rotation to the left, mild pain on rotation to the right. CHEST: Clear to auscultation bilaterally, no wheezes, rhonchi, or crackles. CARDIOVASCULAR: Normal heart rate and rhythm, S1 and S2 normal without murmurs. ABDOMEN: Soft, non-tender, non-distended, without organomegaly, normal bowel sounds. EXTREMITIES: No cyanosis or edema. NEUROLOGICAL: Cranial nerves II-XII grossly intact, extraocular movements intact, normal finger-to-nose test, moves all extremities without gross motor  or sensory deficit, mild pain on right shoulder abduction, no pain on left shoulder abduction.   Labs reviewed: Recent Labs    12/09/22 0839  NA 141  K 4.8  CL 103  CO2 30  GLUCOSE 91  BUN 14  CREATININE 0.79  CALCIUM 10.3*   Recent Labs    12/09/22 0839  AST 15  ALT 10  BILITOT 0.3  PROT 6.7   Recent Labs    12/09/22 0839  WBC 4.3  NEUTROABS 1,853  HGB 12.6  HCT 38.1  MCV 88.4  PLT 231   Lab Results  Component Value Date   TSH 1.66 12/09/2022   No results found for: "HGBA1C" Lab Results  Component Value Date   CHOL 189 04/26/2022   HDL 74 (A) 04/26/2022   LDLCALC 102 04/26/2022   TRIG 64 04/26/2022   CHOLHDL 2.8 11/25/2021    Significant Diagnostic Results in last 30 days:  No results found.  Assessment/Plan     Neck pain Acute neck pain following a self-defense training incident with impact on the edge of a mat.  Pain is localized to the right side with tenderness and increased pain on movement, especially when turning the head to the left. No significant neurological deficits. Differential diagnosis includes muscle strain and potential bone injury due to the cracking sound heard during the incident. - Order neck X-ray at [facility] to assess for bone injury. She can walk in at her convenience. - Prescribe Flexeril  (cyclobenzaprine ) as a muscle relaxant, to be taken up to three times a day as needed, with caution for drowsiness. - Advise application of heat to the affected area to relax muscles. - Instruct to avoid massage until further notice. - Advise to monitor for worsening symptoms, such as increased pain, dizziness, or nausea, and seek medical attention if these occur. - Recommend use of Tylenol  for headache management.  Muscle strain Suspected muscle strain in the neck region due to the mechanism of injury and localized tenderness. The strain likely contributes to the neck pain and discomfort. - Prescribe Flexeril  (cyclobenzaprine ) as a muscle relaxant, to be taken up to three times a day as needed, with caution for drowsiness. - Advise application of heat to the affected area to relax muscles. - Instruct to avoid massage until further notice. - Advise to monitor for worsening symptoms, such as increased pain, dizziness, or nausea, and seek medical attention if these occur.    Family/ staff Communication: Reviewed plan of care with patient verbalized understanding   Labs/tests ordered: Dg Cervical   Next Appointment: .Return if symptoms worsen or fail to improve.   Total time: minutes. Greater than 50% of total time spent doing patient education regarding Neck pain,health maintenance including symptom/medication management.   Estil Heman, NP

## 2023-08-10 NOTE — Telephone Encounter (Signed)
 Patient seen in the office today.

## 2023-08-10 NOTE — Telephone Encounter (Signed)
 Chief Complaint: Neck Pain  Symptoms: Right sided neck pain, upper back pain, headache Frequency: Constant, onset yesterday  Pertinent Negatives: Patient denies neck swelling, bruise, nausea, vomiting Disposition: [] ED /[] Urgent Care (no appt availability in office) / [x] Appointment(In office/virtual)/ []  [place] [facility]/ [] Home Care/ [] Refused Recommended Disposition /[] [place] [facility]/ []  Follow-up with PCP Additional Notes: Patient states she hurt her neck yesterday during a self defense class for work. Patient states she fell awkwardly on the right side of her body. Care advice was given and patient has been scheduled to be seen in office today.    Copied from CRM [id number]. Topic: Clinical - Red Word Triage >> Aug 10, 2023 11:34 AM Jennifer Scott wrote: Red Word that prompted transfer to Nurse Triage: patient was taking self defense classes yesterday and fell on her neck, heard it pop. Is painful and hurts. Reason for Disposition  1] MILD or MODERATE neck pain AND [2] fall from 3 feet (1 meter) or 5 stairs, or higher  Answer Assessment - Initial Assessment Questions 1. MECHANISM: "How did the injury happen?" (e.g., fall, MVA, twisting injury; consider the possibility of domestic violence or elder abuse)     Jennifer Scott awkwardly on the neck 2. ONSET: "When did the injury happen?" (e.g., minutes, hours, days)     Yesterday  3. LOCATION: "What part of the neck is injured?" "Where does it hurt?"     Right side of neck  4. PAIN SEVERITY: "How bad is the pain?" "Can you move the neck normally?" (Scale 1-10; or mild, moderate, severe)   - NO PAIN (0): no pain, or only slight stiffness    - MILD (1-3): doesn't interfere with normal activities    - MODERATE (4-7): interferes with normal activities or awakens from sleep    - SEVERE (8-10):  excruciating pain, unable to do any normal activities       7/10 5. CORD SYMPTOMS: "Any weakness or numbness of the arms or legs?"     No  6. SIZE: For  cuts, bruises, or swelling, ask: "How large is it?" (e.g., inches or centimeters)      No  7. TETANUS: For any breaks in the skin, ask: "When was the last tetanus booster?"     Unsure  8. OTHER SYMPTOMS: "Do you have any other symptoms?" (e.g., headache)     Upper back pain, headache  9. PREGNANCY: "Is there any chance you are pregnant?" "When was your last menstrual period?"     N/A  Protocols used: Neck Injury-A-AH

## 2023-09-04 DIAGNOSIS — F4320 Adjustment disorder, unspecified: Secondary | ICD-10-CM | POA: Diagnosis not present

## 2023-09-18 DIAGNOSIS — F4320 Adjustment disorder, unspecified: Secondary | ICD-10-CM | POA: Diagnosis not present

## 2023-09-26 DIAGNOSIS — F411 Generalized anxiety disorder: Secondary | ICD-10-CM | POA: Diagnosis not present

## 2023-09-26 DIAGNOSIS — Z79899 Other long term (current) drug therapy: Secondary | ICD-10-CM | POA: Diagnosis not present

## 2023-09-26 DIAGNOSIS — F900 Attention-deficit hyperactivity disorder, predominantly inattentive type: Secondary | ICD-10-CM | POA: Diagnosis not present

## 2023-09-26 DIAGNOSIS — F902 Attention-deficit hyperactivity disorder, combined type: Secondary | ICD-10-CM | POA: Diagnosis not present

## 2023-10-04 DIAGNOSIS — F4320 Adjustment disorder, unspecified: Secondary | ICD-10-CM | POA: Diagnosis not present

## 2023-10-18 DIAGNOSIS — F4320 Adjustment disorder, unspecified: Secondary | ICD-10-CM | POA: Diagnosis not present

## 2023-11-07 DIAGNOSIS — F4320 Adjustment disorder, unspecified: Secondary | ICD-10-CM | POA: Diagnosis not present

## 2023-11-21 DIAGNOSIS — F4320 Adjustment disorder, unspecified: Secondary | ICD-10-CM | POA: Diagnosis not present

## 2023-12-06 DIAGNOSIS — F4320 Adjustment disorder, unspecified: Secondary | ICD-10-CM | POA: Diagnosis not present

## 2023-12-11 DIAGNOSIS — Z113 Encounter for screening for infections with a predominantly sexual mode of transmission: Secondary | ICD-10-CM | POA: Diagnosis not present

## 2023-12-11 DIAGNOSIS — Z1231 Encounter for screening mammogram for malignant neoplasm of breast: Secondary | ICD-10-CM | POA: Diagnosis not present

## 2023-12-11 DIAGNOSIS — Z01419 Encounter for gynecological examination (general) (routine) without abnormal findings: Secondary | ICD-10-CM | POA: Diagnosis not present

## 2023-12-11 DIAGNOSIS — Z114 Encounter for screening for human immunodeficiency virus [HIV]: Secondary | ICD-10-CM | POA: Diagnosis not present

## 2023-12-11 DIAGNOSIS — Z13820 Encounter for screening for osteoporosis: Secondary | ICD-10-CM | POA: Diagnosis not present

## 2023-12-11 DIAGNOSIS — Z1151 Encounter for screening for human papillomavirus (HPV): Secondary | ICD-10-CM | POA: Diagnosis not present

## 2023-12-11 DIAGNOSIS — Z124 Encounter for screening for malignant neoplasm of cervix: Secondary | ICD-10-CM | POA: Diagnosis not present

## 2023-12-11 DIAGNOSIS — E7800 Pure hypercholesterolemia, unspecified: Secondary | ICD-10-CM | POA: Diagnosis not present

## 2023-12-11 DIAGNOSIS — E039 Hypothyroidism, unspecified: Secondary | ICD-10-CM | POA: Diagnosis not present

## 2023-12-11 DIAGNOSIS — M8588 Other specified disorders of bone density and structure, other site: Secondary | ICD-10-CM | POA: Diagnosis not present

## 2023-12-14 ENCOUNTER — Other Ambulatory Visit: Payer: Self-pay | Admitting: Obstetrics & Gynecology

## 2023-12-14 DIAGNOSIS — R928 Other abnormal and inconclusive findings on diagnostic imaging of breast: Secondary | ICD-10-CM

## 2023-12-25 ENCOUNTER — Encounter: Payer: Self-pay | Admitting: Nurse Practitioner

## 2023-12-25 DIAGNOSIS — F4320 Adjustment disorder, unspecified: Secondary | ICD-10-CM | POA: Diagnosis not present

## 2023-12-26 ENCOUNTER — Ambulatory Visit: Payer: Self-pay

## 2023-12-26 NOTE — Telephone Encounter (Signed)
 FYI Only or Action Required?: FYI only for provider.  Patient was last seen in primary care on 08/10/2023 by Jennifer Scott, Jennifer BROCKS, NP.  Called Nurse Triage reporting Migraine and Insomnia.  Symptoms began about a month ago.  Interventions attempted: OTC medications: ibuprofen /tylenol leanna.  Symptoms are: unchanged.  Triage Disposition: See PCP When Office is Open (Within 3 Days)  Patient/caregiver understands and will follow disposition?: Yes   Copied from CRM #[id number]. Topic: Clinical - Red Word Triage >> Dec 26, 2023  1:31 PM Susanna ORN wrote: Red Word that prompted transfer to Nurse Triage: Patient having migraines and insomnia. Wanting to get an appt with Dr. Caro as soon as possible. Reason for Disposition  MODERATE - SEVERE insomnia (e.g., awake most of night; lack of sleep interferes with ability to function during the day; interferes with work or school)  [1] MILD-MODERATE headache AND [2] present > 3 days (72 hours) AND [3] no improvement after using Care Advice  Answer Assessment - Initial Assessment Questions 1. LOCATION: Where does it hurt?      Behind eyes 2. ONSET: When did the headache start? (e.g., minutes, hours, days)      One hour ago 3. PATTERN: Does the pain come and go, or has it been constant since it started?     Migraine pain comes and goes 4. SEVERITY: How bad is the pain? and What does it keep you from doing?  (e.g., Scale 1-10; mild, moderate, or severe)     5/10 5. RECURRENT SYMPTOM: Have you ever had headaches before? If Yes, ask: When was the last time? and What happened that time?      History of migraine 6. CAUSE: What do you think is causing the headache?     Unsure, stress 7. MIGRAINE: Have you been diagnosed with migraine headaches? If Yes, ask: Is this headache similar?      Yes, flared up since August 8. HEAD INJURY: Has there been any recent injury to your head?      denies 9. OTHER SYMPTOMS: Do you have any  other symptoms? (e.g., fever, stiff neck, eye pain, sore throat, cold symptoms)     denies 10. PREGNANCY: Is there any chance you are pregnant? When was your last menstrual period?       N/a  Answer Assessment - Initial Assessment Questions 1. DESCRIPTION: Tell me about your sleeping problem. (e.g., waking frequently during night, sleeping during day and awake at night, trouble falling asleep) How bad is it?      Hit or miss. Has been staying up late to work 2. ONSET: How long have you been having trouble sleeping? (e.g., days, weeks, months; longstanding sleep problems)     About a month 3. DAYTIME SLEEP PATTERN: How much time do you spend sleeping or napping during the day?     No daytime sleeping 4. STRESSORS: Is there anything that is making you feel stressed? Is there something that worries you?     Work stress 5. PAIN: Do you have any pain that is keeping you awake? (e.g., back pain, joint pain) If Yes, ask How bad is the pain? (e.g., scale 0-10; mild, moderate, severe).     Migraine pain at times 6. CAFFEINE: Do you drink caffeinated beverages? If Yes, ask How much each day? (e.g., coffee, tea, colas)     Not large amount 7. ALCOHOL USE OR SUBSTANCE USE: Do you drink alcohol or use any substances?     denies 8. MEDICINE CHANGE: Has  there been any recent change in medicines? (e.g., new medicine started, stopped, or dose changed).      denies 9. TREATMENT: What have you done so far to treat this sleep problem? (e.g., prescription or OTC sleep medicines, herbal or dietary supplements, cannabis, relaxation strategies)     Trazadone, magnesium, melatonin 10. OTHER SYMPTOMS: Do you have any other symptoms?  (e.g., difficulty breathing)       denies 11. PREGNANCY: Is there any chance you are pregnant? When was your last menstrual period?       N/a  Protocols used: Headache-A-AH, Insomnia-A-AH

## 2023-12-26 NOTE — Telephone Encounter (Signed)
 Message routed to Dr.Veludandi as FYI

## 2023-12-27 ENCOUNTER — Ambulatory Visit: Admitting: Sports Medicine

## 2023-12-27 ENCOUNTER — Ambulatory Visit

## 2023-12-27 ENCOUNTER — Ambulatory Visit
Admission: RE | Admit: 2023-12-27 | Discharge: 2023-12-27 | Disposition: A | Discharge status: Home / Self Care | Source: Ambulatory Visit | Attending: Obstetrics & Gynecology | Admitting: Obstetrics & Gynecology

## 2023-12-27 DIAGNOSIS — R92331 Mammographic heterogeneous density, right breast: Secondary | ICD-10-CM | POA: Diagnosis not present

## 2023-12-27 DIAGNOSIS — R928 Other abnormal and inconclusive findings on diagnostic imaging of breast: Secondary | ICD-10-CM

## 2023-12-28 ENCOUNTER — Telehealth: Payer: Self-pay

## 2023-12-28 ENCOUNTER — Telehealth: Payer: Self-pay | Admitting: Nurse Practitioner

## 2023-12-28 ENCOUNTER — Telehealth (INDEPENDENT_AMBULATORY_CARE_PROVIDER_SITE_OTHER): Admitting: Nurse Practitioner

## 2023-12-28 DIAGNOSIS — E039 Hypothyroidism, unspecified: Secondary | ICD-10-CM | POA: Diagnosis not present

## 2023-12-28 DIAGNOSIS — G43909 Migraine, unspecified, not intractable, without status migrainosus: Secondary | ICD-10-CM

## 2023-12-28 DIAGNOSIS — G43709 Chronic migraine without aura, not intractable, without status migrainosus: Secondary | ICD-10-CM | POA: Diagnosis not present

## 2023-12-28 DIAGNOSIS — G4700 Insomnia, unspecified: Secondary | ICD-10-CM

## 2023-12-28 DIAGNOSIS — F419 Anxiety disorder, unspecified: Secondary | ICD-10-CM | POA: Diagnosis not present

## 2023-12-28 MED ORDER — NURTEC 75 MG PO TBDP: 75.0000 mg | ORAL_TABLET | ORAL | 0 refills | Status: DC

## 2023-12-28 MED ORDER — NURTEC 75 MG PO TBDP: 75.0000 mg | ORAL_TABLET | ORAL | 1 refills | Status: DC

## 2023-12-28 MED ORDER — LEVOTHYROXINE SODIUM 50 MCG PO TABS: 50.0000 ug | ORAL_TABLET | Freq: Every day | ORAL | 0 refills | Status: DC

## 2023-12-28 MED ORDER — LIOTHYRONINE SODIUM 5 MCG PO TABS: 10.0000 ug | ORAL_TABLET | Freq: Every day | ORAL | 1 refills | Status: DC

## 2023-12-28 NOTE — Telephone Encounter (Signed)
 Notes submitted and medication was approved:    I called patients pharmacy and left a detailed voicemail notifying them of prior authorization approval.

## 2023-12-28 NOTE — Progress Notes (Signed)
 This service is provided via telemedicine  No vital signs collected/recorded due to the encounter was a telemedicine visit.   Location of patient (ex: home, work):  Home  Patient consents to a telephone visit:  Yes  Location of the provider (ex: office, home):  Office [place].   Name of any referring provider:  na  Names of all persons participating in the telemedicine service and their role in the encounter:  Jennifer Scott, Patient, Donzell Beal, CMA, Harlene An, NP  Time spent on call:  7:11

## 2023-12-28 NOTE — Telephone Encounter (Signed)
 1.) Jennifer Scott, patient care advocate gave me FMLA that patient dropped off  2.) Paperwork filled in and placed in [name], [name], NP review and sign folder.

## 2023-12-28 NOTE — Telephone Encounter (Signed)
 Incoming fax received from patients pharmacy to initiate a prior authorization for                   .  PA initiated through covermymeds. Key: [id number]   Unable to proceed with Prior Auth at the moment. Provider will need to respond to the question below:   Has the patient taken a preventative CGRP medication in the past or is the patient switching from another preventative CGRP medication? If yes, answer the question below  Will the patient require TWO calcitonin gene-related peptide (CGRP) antagonist medications for migraine therapy?

## 2023-12-28 NOTE — Telephone Encounter (Signed)
 Noted. Once note is complete (will need to submit to support request) I will proceed with prior authorization.

## 2023-12-28 NOTE — Telephone Encounter (Signed)
 Patient dropped off her FMLA papers at the front desk they were given to [place] in [place].

## 2023-12-28 NOTE — Telephone Encounter (Signed)
 Per secure chart message from [place], Jessica K, NP samples of Nurtec placed at the front desk for pick up.   Done!

## 2023-12-28 NOTE — Telephone Encounter (Signed)
 Has taking tripans and aimvog in the past Just using one medication at this time.

## 2023-12-28 NOTE — Progress Notes (Signed)
 Careteam: Patient Care Team: Caro [name], NP as PCP - General (Geriatric Medicine) Eda Iha, MD (Inactive) as Consulting Physician (Gastroenterology) Porter Andrez SAUNDERS, PA-C (Inactive) as Physician Assistant (Dermatology) Dannielle Bouchard, DO as Consulting Physician (Obstetrics and Gynecology)  Advanced Directive information    No Known Allergies  Chief Complaint  Patient presents with   Migraine   Insomnia    Complains of Migraines and Insomnia. Recurring, gotten bad in the last month.      HPI: Patient is a 48 y.o. female via virtual visit Discussed the use of AI scribe software for clinical note transcription with the patient, who gave verbal consent to proceed.  History of Present Illness Jennifer Scott is a 48 year old female with migraines and anxiety who presents with increased frequency of migraines and insomnia.  Over the past month, she has experienced a significant increase in the frequency and severity of her migraines, which she describes as 'brutal'. She reports that her migraines have worsened during a period of overwhelming workload at her job, where she has been employed for fifteen years. Her migraines are debilitating and sometimes unresponsive to treatments she has tried, such as Imitrex and Goody powders. Currently, she is not on any medication for her migraines.  She reports that her insomnia has been related to work-related stress and difficulty setting boundaries with her workload. She has not been able to sleep due to the pressure of completing tasks without support from management. This lack of sleep has contributed to her anxiety and migraine symptoms.  She has been seeing a therapist, Jennifer Scott, for eight years to manage her anxiety. Her therapist is aware of her current situation and has offered support in managing her mental health and work-related stress.  She is currently taking levothyroxine  and liothyronine  for her  thyroid  condition. Her thyroid  levels were recently checked and were normal. However, she has run out of liothyronine  and needs a refill.   Review of Systems:  Review of Systems  Constitutional:  Negative for chills, fever and weight loss.  HENT:  Negative for tinnitus.   Respiratory:  Negative for cough, sputum production and shortness of breath.   Cardiovascular:  Negative for chest pain, palpitations and leg swelling.  Gastrointestinal:  Negative for abdominal pain, constipation, diarrhea and heartburn.  Genitourinary:  Negative for dysuria, frequency and urgency.  Musculoskeletal:  Negative for back pain, falls, joint pain and myalgias.  Skin: Negative.   Neurological:  Positive for headaches. Negative for dizziness.  Psychiatric/Behavioral:  Negative for depression and memory loss. The patient is nervous/anxious and has insomnia.     Past Medical History:  Diagnosis Date   ADHD 04/2023   Anxiety    Hypothyroidism    Per new patient packet   Medical history non-contributory    Migraines    Substance abuse [facility])    Past Surgical History:  Procedure Laterality Date   BREAST BIOPSY Right 06/26/2019   CERVICAL POLYPECTOMY N/A 10/10/2012   Procedure: CERVICAL POLYPECTOMY;  Surgeon: Bouchard Dannielle, DO;  Location: [facility];  Service: Gynecology;  Laterality: N/A;   COLONOSCOPY  2023   Per new patient packet   HYSTEROSCOPY WITH D & C N/A 10/10/2012   Procedure: DILATATION AND CURETTAGE /HYSTEROSCOPY;  Surgeon: Bouchard Dannielle, DO;  Location: [facility];  Service: Gynecology;  Laterality: N/A;   MRI  2023   Tinnitus, [facility]) / Per new patient packet   WISDOM TOOTH EXTRACTION     Social  History:   reports that she has never smoked. She has never used smokeless tobacco. She reports that she does not currently use alcohol. She reports that she does not use drugs.  Family History  Problem Relation Age of Onset   Migraines Mother    Hypothyroidism Mother    Heart disease  Father    Alzheimer's disease Father    Hypertension Father    Diabetes Father    Hypertension Brother    Cancer Paternal Grandmother    Cancer Paternal Grandfather    Colon cancer Neg Hx    Colon polyps Neg Hx     Medications: Patient's Medications  New Prescriptions   No medications on file  Previous Medications   CYCLOBENZAPRINE  (FLEXERIL ) 5 MG TABLET    Take 1 tablet (5 mg total) by mouth 3 (three) times daily as needed for muscle spasms.   LEVOTHYROXINE  (LEVOXYL ) 50 MCG TABLET    Take 1 tablet (50 mcg total) by mouth daily before breakfast.   LIOTHYRONINE  (CYTOMEL ) 5 MCG TABLET    Take 2 tablets (10 mcg total) by mouth daily.   LISDEXAMFETAMINE (VYVANSE) 40 MG CAPSULE    Take 40 mg by mouth daily.   MELATONIN PO    Take 1 mg by mouth daily as needed.   MULTIPLE VITAMIN (MULTI-DAY VITAMINS PO)    Take by mouth daily.   NALTREXONE (DEPADE) 50 MG TABLET    Take by mouth daily.   TRAZODONE (DESYREL) 50 MG TABLET    Take 50 mg by mouth at bedtime.   UNABLE TO FIND    as needed. Med Name: Anastacia powder   VENLAFAXINE XR (EFFEXOR-XR) 150 MG 24 HR CAPSULE    Take 150 mg by mouth daily.  Modified Medications   No medications on file  Discontinued Medications   No medications on file    Physical Exam:  There were no vitals filed for this visit. There is no height or weight on file to calculate BMI. Wt Readings from Last 3 Encounters:  08/10/23 128 lb (58.1 kg)  12/09/22 129 lb (58.5 kg)  06/20/22 126 lb 3.2 oz (57.2 kg)    Physical Exam Constitutional:      Appearance: Normal appearance.  Pulmonary:     Effort: Pulmonary effort is normal.  Neurological:     Mental Status: She is alert. Mental status is at baseline.  Psychiatric:        Mood and Affect: Mood normal.     Labs reviewed: Basic Metabolic Panel: No results for input(s): NA, K, CL, CO2, GLUCOSE, BUN, CREATININE, CALCIUM, MG, PHOS, TSH in the last 8760 hours. Liver Function Tests: No  results for input(s): AST, ALT, ALKPHOS, BILITOT, PROT, ALBUMIN in the last 8760 hours. No results for input(s): LIPASE, AMYLASE in the last 8760 hours. No results for input(s): AMMONIA in the last 8760 hours. CBC: No results for input(s): WBC, NEUTROABS, HGB, HCT, MCV, PLT in the last 8760 hours. Lipid Panel: No results for input(s): CHOL, HDL, LDLCALC, TRIG, CHOLHDL, LDLDIRECT in the last 8760 hours. TSH: No results for input(s): TSH in the last 8760 hours. A1C: No results found for: HGBA1C   Assessment/Plan Assessment and Plan Assessment & Plan Migraine Chronic migraines exacerbated by stress and lack of sleep. Previous treatments ineffective. (Amivog and triptans in the past) Nurtec considered for its non-sedative properties and early intervention capability. - Prescribe Nurtec for abortive therapy, to be taken at the earliest signs of a migraine.  -if headaches ongoing  can use every other day for preventative.  - Provide samples of Nurtec for immediate use.  Insomnia Insomnia related to work stress and excessive workload, exacerbating migraines and anxiety. - Advise setting work boundaries to improve work-life balance and prioritize sleep. - Support application for FMLA to allow for extended leave to address mental and physical health.  Anxiety disorder Anxiety exacerbated by work stress and lack of sleep. Ongoing therapy with Sabrina Eisenhower. - Support application for FMLA to prioritize mental health. - Continue therapy sessions with Jennifer Scott.  Hypothyroidism Hypothyroidism managed with levothyroxine  and liothyronine . Recent thyroid  levels normal. Current prescription for liothyronine  has run out. - Prescribe 90-day supply of levothyroxine  and liothyronine . - Ensure follow-up appointment to monitor thyroid  levels.  Next appt: to make in office appt for labs, CPE and follow up  Teron Blais K. Caro BODILY  [facility] [phone]    Virtual Visit via video  I connected with patient on 12/28/23 at  8:40 AM EDT by mychart and verified that I am speaking with the correct person using two identifiers.  Location: Patient: work Provider: clinc   I discussed the limitations, risks, security and privacy concerns of performing an evaluation and management service by telephone and the availability of in person appointments. I also discussed with the patient that there may be a patient responsible charge related to this service. The patient expressed understanding and agreed to proceed.   I discussed the assessment and treatment plan with the patient. The patient was provided an opportunity to ask questions and all were answered. The patient agreed with the plan and demonstrated an understanding of the instructions.   The patient was advised to call back or seek an in-person evaluation if the symptoms worsen or if the condition fails to improve as anticipated.  I provided 25 minutes of non-face-to-face time during this encounter.  Hilmer Aliberti K. Caro BODILY Avs printed and mailed

## 2024-01-02 NOTE — Telephone Encounter (Signed)
 Patient has been notified that FMLA is ready for pickup.

## 2024-01-08 DIAGNOSIS — F4320 Adjustment disorder, unspecified: Secondary | ICD-10-CM | POA: Diagnosis not present

## 2024-01-17 DIAGNOSIS — Z79899 Other long term (current) drug therapy: Secondary | ICD-10-CM | POA: Diagnosis not present

## 2024-01-17 DIAGNOSIS — F411 Generalized anxiety disorder: Secondary | ICD-10-CM | POA: Diagnosis not present

## 2024-01-17 DIAGNOSIS — F900 Attention-deficit hyperactivity disorder, predominantly inattentive type: Secondary | ICD-10-CM | POA: Diagnosis not present

## 2024-01-20 ENCOUNTER — Other Ambulatory Visit: Payer: Self-pay | Admitting: Nurse Practitioner

## 2024-01-20 DIAGNOSIS — E039 Hypothyroidism, unspecified: Secondary | ICD-10-CM

## 2024-01-22 DIAGNOSIS — F4320 Adjustment disorder, unspecified: Secondary | ICD-10-CM | POA: Diagnosis not present

## 2024-01-22 NOTE — Telephone Encounter (Signed)
 Patient is requesting additional refills on medication. Patient was given 60 day supply with 1 refill.

## 2024-02-07 DIAGNOSIS — F4320 Adjustment disorder, unspecified: Secondary | ICD-10-CM | POA: Diagnosis not present

## 2024-02-23 ENCOUNTER — Ambulatory Visit: Payer: Self-pay | Admitting: Nurse Practitioner

## 2024-02-23 ENCOUNTER — Encounter: Payer: Self-pay | Admitting: Nurse Practitioner

## 2024-02-23 VITALS — BP 128/76 | HR 72 | Temp 97.7°F | Ht 68.0 in | Wt 123.0 lb

## 2024-02-23 DIAGNOSIS — Z0000 Encounter for general adult medical examination without abnormal findings: Secondary | ICD-10-CM

## 2024-02-23 DIAGNOSIS — F419 Anxiety disorder, unspecified: Secondary | ICD-10-CM | POA: Diagnosis not present

## 2024-02-23 DIAGNOSIS — G4700 Insomnia, unspecified: Secondary | ICD-10-CM

## 2024-02-23 DIAGNOSIS — E039 Hypothyroidism, unspecified: Secondary | ICD-10-CM | POA: Diagnosis not present

## 2024-02-23 DIAGNOSIS — G43909 Migraine, unspecified, not intractable, without status migrainosus: Secondary | ICD-10-CM | POA: Diagnosis not present

## 2024-02-23 LAB — COMPREHENSIVE METABOLIC PANEL WITH GFR
AG Ratio: 2.2 (calc) (ref 1.0–2.5)
ALT: 12 U/L (ref 6–29)
AST: 19 U/L (ref 10–35)
Albumin: 4.3 g/dL (ref 3.6–5.1)
Alkaline phosphatase (APISO): 54 U/L (ref 31–125)
BUN: 10 mg/dL (ref 7–25)
CO2: 29 mmol/L (ref 20–32)
Calcium: 9.4 mg/dL (ref 8.6–10.2)
Chloride: 104 mmol/L (ref 98–110)
Creat: 0.8 mg/dL (ref 0.50–0.99)
Globulin: 2 g/dL (ref 1.9–3.7)
Glucose, Bld: 95 mg/dL (ref 65–99)
Potassium: 4.4 mmol/L (ref 3.5–5.3)
Sodium: 140 mmol/L (ref 135–146)
Total Bilirubin: 0.3 mg/dL (ref 0.2–1.2)
Total Protein: 6.3 g/dL (ref 6.1–8.1)
eGFR: 90 mL/min/1.73m2 (ref >=60)

## 2024-02-23 LAB — CBC WITH DIFFERENTIAL/PLATELET
Absolute Lymphocytes: 2034 {cells}/uL (ref 850–3900)
Absolute Monocytes: 510 {cells}/uL (ref 200–950)
Basophils Absolute: 20 {cells}/uL (ref 0–200)
Basophils Relative: 0.4 %
Eosinophils Absolute: 0 {cells}/uL — ABNORMAL LOW (ref 15–500)
Eosinophils Relative: 0 %
HCT: 40.5 % (ref 35.0–45.0)
Hemoglobin: 13.1 g/dL (ref 11.7–15.5)
MCH: 29.4 pg (ref 27.0–33.0)
MCHC: 32.3 g/dL (ref 32.0–36.0)
MCV: 90.8 fL (ref 80.0–100.0)
MPV: 10 fL (ref 7.5–12.5)
Monocytes Relative: 10.4 %
Neutro Abs: 2337 {cells}/uL (ref 1500–7800)
Neutrophils Relative %: 47.7 %
Platelets: 276 Thousand/uL (ref 140–400)
RBC: 4.46 Million/uL (ref 3.80–5.10)
RDW: 12.9 % (ref 11.0–15.0)
Total Lymphocyte: 41.5 %
WBC: 4.9 Thousand/uL (ref 3.8–10.8)

## 2024-02-23 LAB — TSH: TSH: 2.86 m[IU]/L

## 2024-02-23 NOTE — Progress Notes (Signed)
 Provider: Caro [name], NP  Patient Care Team: Caro [name], NP as PCP - General (Geriatric Medicine) Eda Iha, MD (Inactive) as Consulting Physician (Gastroenterology) Porter Andrez SAUNDERS, PA-C (Inactive) as Physician Assistant (Dermatology) Dannielle Bouchard, DO as Consulting Physician (Obstetrics and Gynecology)  Extended Emergency Contact Information Primary Emergency Contact: Hefner,Nora Address: [address]          [place], [place] [address] [place] Home Phone: [phone] Relation: Friend No Known Allergies Code Status: FULL Goals of Care: Advanced Directive information    08/10/2023    1:15 PM  Advanced Directives  Does Patient Have a Medical Advance Directive? No  Would patient like information on creating a medical advance directive? No - Patient declined     Chief Complaint  Patient presents with   Annual Exam    PT declined all vaccines  Mammogram records requested.     HPI: Patient is a 48 y.o. female seen in today for an wellness exam at [facility]  Discussed the use of AI scribe software for clinical note transcription with the patient, who gave verbal consent to proceed.  History of Present Illness Jennifer Scott is a 48 year old female who presents for an annual physical exam and health maintenance.  She recently underwent a mammogram due to dense breast tissue, which required additional imaging, but the results were normal. She also completed a Pap smear and bone density test on the same day.  She has not received a flu shot and is up to date on her colonoscopy.   She smokes. She has not had recent blood work for thyroid .  Her migraines and insomnia have improved significantly with the use of Nurtec and trazodone, respectively. She is currently on FMLA for mental health and is considering extending it to 12 weeks. She is engaging in physical activities like hiking and walking, which have been beneficial.  She is taking  Adderall for ADHD but has experienced a shortage recently. She previously took Vyvanse and is considering consolidating her prescriptions with one provider. She is also on Effexor 150 mg for mood stabilization. She is followed by psychology and psychiatrist.   She takes levothyroxine  and Cytomel  for thyroid  management, having previously been on Armour, which was expensive. She is stable on her current regimen.  She has lost weight, likely due to Adderall affecting her appetite, and is ensuring she maintains adequate nutrition.       02/23/2024    9:25 AM 12/06/2021    8:27 AM 08/30/2021    1:29 PM 12/04/2020   10:21 AM  Depression screen PHQ 2/9  Decreased Interest 0 0 0 0  Down, Depressed, Hopeless 0 0 0 0  PHQ - 2 Score 0 0 0 0       12/04/2020   10:21 AM 08/30/2021    1:29 PM 12/06/2021    8:27 AM 12/09/2022    8:11 AM 02/23/2024    9:25 AM  Fall Risk  Falls in the past year? 0 0 0 0 0  Was there an injury with Fall? 0 0 0 0 0  Fall Risk Category Calculator 0 0 0 0 0  Fall Risk Category (Retired) Low  Low  Low     (RETIRED) Patient Fall Risk Level Low fall risk  Low fall risk  Low fall risk     Patient at Risk for Falls Due to No Fall Risks No Fall Risks No Fall Risks No Fall Risks No Fall Risks  Fall risk Follow up Falls evaluation completed  Falls evaluation completed  Falls evaluation completed  Falls evaluation completed;Education provided;Falls prevention discussed Falls evaluation completed     Data saved with a previous flowsheet row definition       No data to display           Health Maintenance  Topic Date Due   HIV Screening  Never done   Cervical Cancer Screening (HPV/Pap Cotest)  Never done   Mammogram  12/17/2022   COVID-19 Vaccine (4 - 2025-26 season) 03/10/2024 (Originally 12/18/2023)   Influenza Vaccine  07/16/2024 (Originally 11/17/2023)   Pneumococcal Vaccine (1 of 2 - PCV) 02/22/2025 (Originally 01/06/1994)   Hepatitis B Vaccines 19-59 Average Risk (1 of  3 - 19+ 3-dose series) 02/22/2025 (Originally 01/06/1994)   Colonoscopy  02/04/2028   DTaP/Tdap/Td (3 - Td or Tdap) 08/20/2030   HPV VACCINES  Aged Out   Meningococcal B Vaccine  Aged Out   Hepatitis C Screening  Discontinued    Past Medical History:  Diagnosis Date   ADHD 04/2023   Anxiety    Hypothyroidism    Per new patient packet   Medical history non-contributory    Migraines    Substance abuse (HCC)     Past Surgical History:  Procedure Laterality Date   BREAST BIOPSY Right 06/26/2019   CERVICAL POLYPECTOMY N/A 10/10/2012   Procedure: CERVICAL POLYPECTOMY;  Surgeon: Duwaine Blumenthal, DO;  Location: [facility];  Service: Gynecology;  Laterality: N/A;   COLONOSCOPY  2023   Per new patient packet   HYSTEROSCOPY WITH D & C N/A 10/10/2012   Procedure: DILATATION AND CURETTAGE /HYSTEROSCOPY;  Surgeon: Duwaine Blumenthal, DO;  Location: [facility];  Service: Gynecology;  Laterality: N/A;   MRI  2023   Tinnitus, [facility]) / Per new patient packet   WISDOM TOOTH EXTRACTION      Social History   Socioeconomic History   Marital status: Single    Spouse name: Not on file   Number of children: Not on file   Years of education: Not on file   Highest education level: Not on file  Occupational History   Not on file  Tobacco Use   Smoking status: Never   Smokeless tobacco: Never  Vaping Use   Vaping status: Some Days  Substance and Sexual Activity   Alcohol use: Not Currently   Drug use: No   Sexual activity: Not on file    Comment: not asked  Other Topics Concern   Not on file  Social History Narrative   Diet: Could use some work      Caffeine: Yes      Married, if yes what year: Single      Do you live in a house, apartment, assisted living, condo, trailer, ect: House      Is it one or more stories: Yes      How many persons live in your home? 1      Pets:1 dog      Highest level or education completed: Bachelor Degree      Current/Past profession:  Engineer, drilling      Exercise:  Yes                Type and how often: Weekly/Walk         Living Will: No   DNR: No   POA/HPOA: No      Functional Status:   Do you have difficulty bathing or dressing yourself?  No   Do you have difficulty preparing food or eating? No    Do you have difficulty managing your medications? No   Do you have difficulty managing your finances? No   Do you have difficulty affording your medications? No      Social Drivers of Corporate Investment Banker Strain: Not on file  Food Insecurity: Not on file  Transportation Needs: Not on file  Physical Activity: Not on file  Stress: Not on file  Social Connections: Not on file    Family History  Problem Relation Age of Onset   Migraines Mother    Hypothyroidism Mother    Heart disease Father    Alzheimer's disease Father    Hypertension Father    Diabetes Father    Hypertension Brother    Cancer Paternal Grandmother    Cancer Paternal Grandfather    Colon cancer Neg Hx    Colon polyps Neg Hx     Review of Systems:  Review of Systems  Constitutional:  Negative for chills, fever and weight loss.  HENT:  Negative for tinnitus.   Respiratory:  Negative for cough, sputum production and shortness of breath.   Cardiovascular:  Negative for chest pain, palpitations and leg swelling.  Gastrointestinal:  Negative for abdominal pain, constipation, diarrhea and heartburn.  Genitourinary:  Negative for dysuria, frequency and urgency.  Musculoskeletal:  Negative for back pain, falls, joint pain and myalgias.  Skin: Negative.   Neurological:  Negative for dizziness and headaches.  Psychiatric/Behavioral:  Negative for depression and memory loss. The patient does not have insomnia.      Allergies as of 02/23/2024   No Known Allergies      Medication List        Accurate as of February 23, 2024  9:32 AM. If you have any questions, ask your nurse or doctor.           amphetamine-dextroamphetamine 20 MG 24 hr capsule Commonly known as: ADDERALL XR Take 20 mg by mouth daily.   levothyroxine  50 MCG tablet Commonly known as: Levoxyl  Take 1 tablet (50 mcg total) by mouth daily before breakfast.   liothyronine  5 MCG tablet Commonly known as: CYTOMEL  TAKE 2 TABLETS BY MOUTH DAILY.   lisdexamfetamine 40 MG capsule Commonly known as: VYVANSE Take 40 mg by mouth daily.   MELATONIN PO Take 1 mg by mouth daily as needed.   MULTI-DAY VITAMINS PO Take by mouth daily.   Nurtec 75 MG Tbdp Generic drug: Rimegepant Sulfate Take 1 tablet (75 mg total) by mouth every other day.   Nurtec 75 MG Tbdp Generic drug: Rimegepant Sulfate Take 1 tablet (75 mg total) by mouth every other day. LOT [id number] EXP 02/2026   progesterone  200 MG capsule Commonly known as: PROMETRIUM  Take 200 mg by mouth daily.   traZODone 50 MG tablet Commonly known as: DESYREL Take 50 mg by mouth at bedtime.   UNABLE TO FIND as needed. Med Name: Anastacia powder   venlafaxine XR 150 MG 24 hr capsule Commonly known as: EFFEXOR-XR Take 150 mg by mouth daily.          Physical Exam: Vitals:   02/23/24 0921  BP: 128/76  Pulse: 72  Temp: 97.7 F (36.5 C)  SpO2: 98%  Weight: 123 lb (55.8 kg)  Height: 5' 8 (1.727 m)   Body mass index is 18.7 kg/m. Wt Readings from Last 3 Encounters:  02/23/24 123 lb (55.8 kg)  08/10/23 128 lb (58.1 kg)  12/09/22 129 lb (58.5 kg)    Physical Exam Constitutional:      General: She is not in acute distress.    Appearance: She is well-developed. She is not diaphoretic.  HENT:     Head: Normocephalic and atraumatic.     Mouth/Throat:     Pharynx: No oropharyngeal exudate.  Eyes:     Conjunctiva/sclera: Conjunctivae normal.     Pupils: Pupils are equal, round, and reactive to light.  Cardiovascular:     Rate and Rhythm: Normal rate and regular rhythm.     Heart sounds: Normal heart sounds.  Pulmonary:     Effort: Pulmonary  effort is normal.     Breath sounds: Normal breath sounds.  Abdominal:     General: Bowel sounds are normal.     Palpations: Abdomen is soft.  Musculoskeletal:     Cervical back: Normal range of motion and neck supple.     Right lower leg: No edema.     Left lower leg: No edema.  Skin:    General: Skin is warm and dry.  Neurological:     Mental Status: She is alert.  Psychiatric:        Mood and Affect: Mood normal.     Labs reviewed: Basic Metabolic Panel: No results for input(s): NA, K, CL, CO2, GLUCOSE, BUN, CREATININE, CALCIUM, MG, PHOS, TSH in the last 8760 hours. Liver Function Tests: No results for input(s): AST, ALT, ALKPHOS, BILITOT, PROT, ALBUMIN in the last 8760 hours. No results for input(s): LIPASE, AMYLASE in the last 8760 hours. No results for input(s): AMMONIA in the last 8760 hours. CBC: No results for input(s): WBC, NEUTROABS, HGB, HCT, MCV, PLT in the last 8760 hours. Lipid Panel: No results for input(s): CHOL, HDL, LDLCALC, TRIG, CHOLHDL, LDLDIRECT in the last 8760 hours. No results found for: HGBA1C  Procedures: No results found.  Assessment/Plan  Assessment & Plan Adult Wellness Visit Annual wellness visit conducted. Mammogram, Pap smear completed. Bone density scan deferred due to cost concerns. Flu shot declined. Pneumonia vaccine recommended due to smoking history, but she prefers to quit smoking instead. Cholesterol screening deferred due to possible insurance coverage issues. -PAP and mammograms done at GYN- will request records - Encouraged smoking cessation. - Deferred bone density scan - Deferred cholesterol screening due to insurance coverage issues.  Hypothyroidism Currently managed with levothyroxine  and Cytomel . Previous trial of Armour thyroid  was discontinued due to cost. - Continue current thyroid  medication regimen with levothyroxine  and Cytomel .  Migraine Migraines  have improved significantly with current management, including stress reduction and Nurtec. - Continue Nurtec for migraine management.  Insomnia Managed with trazodone, which she takes nightly. Reports improvement in sleep quality and mood. - Continue trazodone nightly for insomnia.  Anxiety disorder Managed with Effexor 150 mg. Reports improvement in mood and anxiety symptoms. - Continue Effexor 150 mg for anxiety management.  Attention-deficit hyperactivity disorder (ADHD) ADHD managed by a specialist with Adderall. Reports issues with pharmacy availability but has found a pharmacy that can provide the medication.  - Continue Adderall as prescribed by ADHD specialist but discussed moving prescription to [facility]   Eustachian tube dysfunction with fluid behind ear Fluid behind the ear noted on examination. No significant symptoms reported.  - Consider over-the-counter antihistamines like Zyrtec or Claritin if symptoms develop.   Next appt:  6months, sooner if needed  Edit Ricciardelli K. Caro BODILY  [facility] [phone]

## 2024-02-25 ENCOUNTER — Ambulatory Visit: Payer: Self-pay | Admitting: Nurse Practitioner

## 2024-03-04 ENCOUNTER — Encounter: Payer: Self-pay | Admitting: Nurse Practitioner

## 2024-03-12 DIAGNOSIS — F4320 Adjustment disorder, unspecified: Secondary | ICD-10-CM | POA: Diagnosis not present

## 2024-03-17 ENCOUNTER — Encounter: Payer: Self-pay | Admitting: Nurse Practitioner

## 2024-03-17 DIAGNOSIS — H9203 Otalgia, bilateral: Secondary | ICD-10-CM

## 2024-03-19 ENCOUNTER — Encounter: Payer: Self-pay | Admitting: Nurse Practitioner

## 2024-03-20 NOTE — Telephone Encounter (Signed)
 Form filled in and placed on provider desk, review and sign folder

## 2024-03-21 ENCOUNTER — Encounter (INDEPENDENT_AMBULATORY_CARE_PROVIDER_SITE_OTHER): Payer: Self-pay

## 2024-03-24 ENCOUNTER — Other Ambulatory Visit: Payer: Self-pay | Admitting: Nurse Practitioner

## 2024-03-24 DIAGNOSIS — E039 Hypothyroidism, unspecified: Secondary | ICD-10-CM

## 2024-03-25 NOTE — Telephone Encounter (Signed)
Form completed and given to CI

## 2024-03-25 NOTE — Telephone Encounter (Signed)
 Harlene requested the Medication List to be printed and attached to the forms. Message sent to patient for Fax #  Form states to Mail or Fax to Eleanor Blunt, PHD Chief US  [facility]ation [facility] [place] [address] [place] [address].

## 2024-03-27 NOTE — Telephone Encounter (Signed)
 Form placed in mail. Mailed to address below.   Eleanor Blunt, PHD Chief US  [facility]ation [facility] [place] [address] [place] [address].

## 2024-03-28 DIAGNOSIS — F4320 Adjustment disorder, unspecified: Secondary | ICD-10-CM | POA: Diagnosis not present

## 2024-04-01 ENCOUNTER — Encounter: Payer: Self-pay | Admitting: Nurse Practitioner

## 2024-04-01 DIAGNOSIS — G43909 Migraine, unspecified, not intractable, without status migrainosus: Secondary | ICD-10-CM

## 2024-04-09 ENCOUNTER — Other Ambulatory Visit: Payer: Self-pay | Admitting: Nurse Practitioner

## 2024-04-09 DIAGNOSIS — G43909 Migraine, unspecified, not intractable, without status migrainosus: Secondary | ICD-10-CM

## 2024-04-09 DIAGNOSIS — F4320 Adjustment disorder, unspecified: Secondary | ICD-10-CM | POA: Diagnosis not present

## 2024-04-10 NOTE — Telephone Encounter (Signed)
 Can we provide a year supply of this medication?

## 2024-04-16 ENCOUNTER — Ambulatory Visit (INDEPENDENT_AMBULATORY_CARE_PROVIDER_SITE_OTHER): Admitting: Physician Assistant

## 2024-04-16 ENCOUNTER — Encounter (INDEPENDENT_AMBULATORY_CARE_PROVIDER_SITE_OTHER): Payer: Self-pay | Admitting: Physician Assistant

## 2024-04-16 VITALS — BP 118/76 | HR 75 | Temp 97.4°F | Ht 68.0 in | Wt 130.0 lb

## 2024-04-16 DIAGNOSIS — H9313 Tinnitus, bilateral: Secondary | ICD-10-CM | POA: Diagnosis not present

## 2024-04-16 DIAGNOSIS — H6993 Unspecified Eustachian tube disorder, bilateral: Secondary | ICD-10-CM

## 2024-04-16 MED ORDER — FLUTICASONE PROPIONATE 50 MCG/ACT NA SUSP: 2.0000 | Freq: Every day | NASAL | 6 refills | Status: AC

## 2024-04-16 NOTE — Progress Notes (Signed)
 Dear Dr. Caro, Here is my assessment for our mutual patient, Jennifer Scott. Thank you for allowing me the opportunity to care for your patient. Please do not hesitate to contact me should you have any other questions. Sincerely, Chyrl Cohen PA-C  Otolaryngology Clinic Note Referring provider: Dr. Caro HPI:  Jennifer Scott is a 48 y.o. female kindly referred by Dr. Caro   Discussed the use of AI scribe software for clinical note transcription with the patient, who gave verbal consent to proceed.  History of Present Illness    Jennifer Scott is a 48 year old female with high-frequency hearing loss who presents with bilateral ear pressure and tinnitus.  Approximately one month ago, she was noted to have middle ear effusion on examination by her primary care physician, though she was asymptomatic at that time. One week later, she developed bilateral ear pressure described as a sensation of fullness, similar to 'swimmer's ear' or a need to pop the ears. The pressure is persistent in both ears without associated clicking or popping. She denies recurrent otitis media, otologic trauma, or prior otologic surgery.  She experiences intermittent bilateral tinnitus, which is sometimes difficult to localize. The tinnitus is persistent but not severe, and is exacerbated by migraines, stress, and sleep deprivation. There are periods when the tinnitus is absent. She has not noticed any current hearing loss, though audiometry two years ago demonstrated high-frequency hearing loss.  She previously used nasal saline irrigation and intranasal corticosteroid spray for several weeks without improvement. She is uncertain of the exact nasal medication prescribed but believes it was a steroid nasal spray, possibly over-the-counter. She has not used oral antihistamines recently. She denies allergic rhinitis, nasal congestion, or recent upper respiratory infection.           Independent Review of  Additional Tests or Records:  Patient message 03/17/2024 Office visit note 02/23/2024  PMH/Meds/All/SocHx/FamHx/ROS:   Past Medical History:  Diagnosis Date   ADHD 04/2023   Anxiety    Hypothyroidism    Per new patient packet   Medical history non-contributory    Migraines    Substance abuse [facility])      Past Surgical History:  Procedure Laterality Date   BREAST BIOPSY Right 06/26/2019   CERVICAL POLYPECTOMY N/A 10/10/2012   Procedure: CERVICAL POLYPECTOMY;  Surgeon: Duwaine Blumenthal, DO;  Location: [facility];  Service: Gynecology;  Laterality: N/A;   COLONOSCOPY  2023   Per new patient packet   HYSTEROSCOPY WITH D & C N/A 10/10/2012   Procedure: DILATATION AND CURETTAGE /HYSTEROSCOPY;  Surgeon: Duwaine Blumenthal, DO;  Location: [facility];  Service: Gynecology;  Laterality: N/A;   MRI  2023   Tinnitus, Jennifer Scott) / Per new patient packet   WISDOM TOOTH EXTRACTION      Family History  Problem Relation Age of Onset   Migraines Mother    Hypothyroidism Mother    Heart disease Father    Alzheimer's disease Father    Hypertension Father    Diabetes Father    Hypertension Brother    Cancer Paternal Grandmother    Cancer Paternal Grandfather    Colon cancer Neg Hx    Colon polyps Neg Hx      Social Connections: Not on file     Current Medications[1]   Physical Exam:   BP 118/76   Pulse 75   Temp (!) 97.4 F (36.3 C)   Ht 5' 8 (1.727 m)   Wt 130 lb (59 kg)   SpO2  98%   BMI 19.77 kg/m   Pertinent Findings  CN II-XII grossly intact Bilateral EAC clear and TM intact with well pneumatized middle ear spaces Anterior rhinoscopy: Septum midline; bilateral inferior turbinates with no hypertrophy No lesions of oral cavity/oropharynx; dentition within normal limits No obviously palpable neck masses/lymphadenopathy/thyromegaly No respiratory distress or stridor       Seprately Identifiable Procedures:  None  Impression & Plans:  Jennifer Scott is a 48 y.o.  female with the following   Assessment and Plan    Bilateral tinnitus Intermittent bilateral tinnitus, no alarming features - Ordered audiogram  - Provided education on exacerbating factors: stress, migraine, sleep deprivation, caffeine.  Eustachian tube dysfunction Bilateral ear pressure and fullness. No middle ear effusion or fluid observed. - Ordered audiogram  - Recommended daily nasal saline irrigation and intranasal steroid spray for up to three months. - Sent prescription for Flonase. - Consider further interventions if symptoms persist after three months.           - f/u phone call with audiological results   Thank you for allowing me the opportunity to care for your patient. Please do not hesitate to contact me should you have any other questions.  Sincerely, Chyrl Cohen PA-C [facility] Phone: [phone] Fax: [phone]  04/16/2024, 3:14 PM        [1]  Current Outpatient Medications:    amphetamine-dextroamphetamine (ADDERALL XR) 20 MG 24 hr capsule, Take 20 mg by mouth daily., Disp: , Rfl:    fluticasone (FLONASE) 50 MCG/ACT nasal spray, Place 2 sprays into both nostrils daily., Disp: 16 g, Rfl: 6   levothyroxine  (SYNTHROID ) 50 MCG tablet, TAKE 1 TABLET BY MOUTH DAILY BEFORE BREAKFAST, Disp: 90 tablet, Rfl: 1   liothyronine  (CYTOMEL ) 5 MCG tablet, TAKE 2 TABLETS BY MOUTH DAILY., Disp: 180 tablet, Rfl: 1   Multiple Vitamin (MULTI-DAY VITAMINS PO), Take by mouth daily., Disp: , Rfl:    NURTEC 75 MG TBDP, TAKE 1 TABLET (75 MG TOTAL) BY MOUTH EVERY OTHER DAY, Disp: 16 tablet, Rfl: 11   progesterone  (PROMETRIUM ) 200 MG capsule, Take 200 mg by mouth daily., Disp: , Rfl:    traZODone (DESYREL) 50 MG tablet, Take 50 mg by mouth at bedtime., Disp: , Rfl:    UNABLE TO FIND, as needed. Med Name: Anastacia powder, Disp: , Rfl:    venlafaxine XR (EFFEXOR-XR) 150 MG 24 hr capsule, Take 150 mg by mouth daily., Disp: , Rfl:

## 2024-04-26 NOTE — Telephone Encounter (Signed)
 Spoke with patient the changes that has been made and has been given to administration staff to place in the scan folder and will send MyChart message to the patient.

## 2024-04-26 NOTE — Telephone Encounter (Signed)
 Completed forms and given to CI

## 2024-05-13 ENCOUNTER — Ambulatory Visit (INDEPENDENT_AMBULATORY_CARE_PROVIDER_SITE_OTHER): Admitting: Audiology

## 2024-05-20 ENCOUNTER — Ambulatory Visit: Admitting: Nurse Practitioner

## 2024-05-23 ENCOUNTER — Encounter: Admitting: Nurse Practitioner

## 2024-05-23 NOTE — Progress Notes (Signed)
 Tried calling patient 3 times for Virtual appointment. No connection made. LMOM to call back to reschedule.  9:44, 9:52, 10:01.

## 2024-05-23 NOTE — Progress Notes (Signed)
 This encounter was created in error - please disregard.

## 2024-07-30 ENCOUNTER — Ambulatory Visit: Payer: Self-pay | Admitting: Neurology

## 2024-08-23 ENCOUNTER — Ambulatory Visit: Admitting: Nurse Practitioner
